# Patient Record
Sex: Male | Born: 1970 | Race: Black or African American | Hispanic: No | Marital: Single | Smoking: Current every day smoker
Health system: Southern US, Community
[De-identification: ages and names within clinical notes are randomized; demographics above are authoritative.]

## PROBLEM LIST (undated history)

## (undated) DIAGNOSIS — F319 Bipolar disorder, unspecified: Secondary | ICD-10-CM

## (undated) DIAGNOSIS — F32A Depression, unspecified: Secondary | ICD-10-CM

## (undated) DIAGNOSIS — F329 Major depressive disorder, single episode, unspecified: Secondary | ICD-10-CM

## (undated) DIAGNOSIS — R569 Unspecified convulsions: Secondary | ICD-10-CM

---

## 2007-08-14 ENCOUNTER — Emergency Department (HOSPITAL_COMMUNITY): Admission: EM | Admit: 2007-08-14 | Discharge: 2007-08-14 | Payer: Self-pay | Admitting: Emergency Medicine

## 2007-08-15 ENCOUNTER — Emergency Department (HOSPITAL_COMMUNITY): Admission: EM | Admit: 2007-08-15 | Discharge: 2007-08-16 | Payer: Self-pay | Admitting: Emergency Medicine

## 2007-08-16 ENCOUNTER — Ambulatory Visit: Payer: Self-pay | Admitting: Psychiatry

## 2007-08-16 ENCOUNTER — Inpatient Hospital Stay (HOSPITAL_COMMUNITY): Admission: AD | Admit: 2007-08-16 | Discharge: 2007-08-21 | Payer: Self-pay | Admitting: Psychiatry

## 2010-07-27 NOTE — H&P (Signed)
NAME:  Matthew Mora, PARISIEN NO.:  [id number]   MEDICAL RECORD NO.:  [id number]          PATIENT TYPE:  IPS   LOCATION:  0401                          FACILITY:  [facility]   PHYSICIAN:  Anselm Jungling, MD  DATE OF BIRTH:  1971-01-26   DATE OF ADMISSION:  08/16/2007  DATE OF DISCHARGE:                       PSYCHIATRIC ADMISSION ASSESSMENT   IDENTIFYING INFORMATION:  This is a 40 year old African American male,  voluntary admission.   HISTORY OF PRESENT ILLNESS:  First [facility] admission for this gentleman who  presented in the emergency room complaining of a bad headache and chest  pain.  He told the emergency room physician that he had hidden a knife  outside the emergency room and if he did not get treated for his chest  pain, he would go out and slash his wrist.  He had been also in our  emergency room on the previous day, where he was treated for a headache  and had indicated that he was homeless and had nowhere to go.  He was  discharged to the street at that time and returned to the emergency room  yesterday.  Today he reports that he has been living at an [facility]  for the past 4 months and had been abstinent from cocaine for 2 years.  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  He found himself  homeless and then relapsed Monday night on cocaine.  He says that he had  purposely used a large amount of cocaine this past week in order to kill  myself, had also made threats to cut his wrist.  He reports a history of  prior suicide attempts x6.  Denying any hallucinations today.  Denying  any active suicidal thoughts.   PAST PSYCHIATRIC HISTORY:  [place] reports a history of cocaine abuse since  he was age 73 and has recently had his longest period abstinent of 2  years.  Prior to being in the [facility], where he has  been for the  past 4 months here in [place], he was at [employer] in [place],  [place], where he was able to achieve abstinence.  Says that he  did well there.  He has been treated in the past with Seroquel and  Zoloft and also Depakote to try to stabilize his mood.  Was diagnosed  with a bipolar disorder in 2005.  Says that cocaine is his primary  addiction.  Has not been treated with amantadine or Symmetrel in the  past.   SOCIAL HISTORY:  A single African American male.  Family lives in  [place] and says that he does not want to return to [place] because  he was exposed to too many drugs there through his family.  He had lost  his job and was evicted from his [address] and is currently homeless.  He denies any current legal charges.   FAMILY HISTORY:  Remarkable for multiple members  with substance abuse.  Both parents with a history of alcoholism.   ALCOHOL AND DRUG HISTORY:  Noted above.   MEDICAL PROBLEMS:  Recent complaints of headache and chest pain.   CURRENT MEDICATIONS:  None.   DRUG ALLERGIES:  None.   PHYSICAL EXAM:  Done in the emergency room and as noted in the record,  he was noted to be uncooperative with staff in the emergency room but  did not act out.  He is 6 feet 2 inches tall, 174 pounds.  Temperature 97.8, pulse 68,  respirations 16, blood pressure 104/71.  He did not display any acute withdrawal symptoms in the emergency room.   Urine drug screen was positive for cocaine, negative for all other  substances.  His alcohol level was less than 5.  Chemistry within normal  limits.  Liver enzymes normal.  CBC within normal limits.  His BUN is 8,  creatinine 1.32.  A urinalysis remarkable for wbc's 11-20 and he reports  he is asymptomatic.  A noncontrast head CT was done because of his  complaint of headache and there was no evidence of any acute findings.  A chest x-ray revealed no cardiopulmonary disease.  An ECG revealed no  acute  findings.   MENTAL STATUS EXAM:  A fully alert male, poor eye contact.  He has been  reclusive to the room, staying in bed today, declining to participate in  group.  Affect is constricted.  Concentration is decreased.  Mood is  depressed, slightly irritable.  Thought content remarkable for reporting  that he has had some suicidal thoughts.  He has been cooperative here,  feels he can be safe on the unit.  Some racing thoughts.  Very depressed  about his relapse, depressed about his homelessness.  He is alert and  oriented x3.  No delusional statements.  Does not appear to be  internally distracted.  No signs of psychosis or delirium.  Judgment  poor.  Insight minimal.   AXIS I:  1.  Bipolar disorder, depressed.  2.  Cocaine abuse, rule out  dependence.  AXIS II:  Deferred.  AXIS III:  1.  Headache.  2.  History of chest pain, not otherwise  specified, resolved.  AXIS IV:  Severe problems with homelessness, lack of family support.  AXIS V:  Current 30, past year not known.   PLAN:  Voluntarily admit him to our dual-diagnosis program.  We are  going to restart his Depakote 1000 mg p.o. nightly., Celexa 20 mg q.a.m.  We will also start him on Librium 25 mg q.6 h. p.r.n. for any withdrawal  symptoms and have discussed amantadine 100 mg b.i.d. for his cocaine  cravings and addiction and that he has agreed to the care plan.  Estimated length of stay is 5 days.      Margaret A. Lorin Picket, N.P.      Anselm Jungling, MD  Electronically Signed    MAS/MEDQ  D:  08/16/2007  T:  08/16/2007  Job:  [id number]

## 2010-07-30 NOTE — Discharge Summary (Signed)
NAME:  JEROL, RUFENER NO.:  [id number]   MEDICAL RECORD NO.:  [id number]          PATIENT TYPE:  IPS   LOCATION:  0500                          FACILITY:  BH   PHYSICIAN:  Antonietta Breach, M.D.  DATE OF BIRTH:  March 06, 1971   DATE OF ADMISSION:  08/16/2007  DATE OF DISCHARGE:  08/21/2007                               DISCHARGE SUMMARY   IDENTIFYING DATA/REASON FOR ADMISSION:  Mr. Chittum is a 40 year old  male who presented to the emergency room with confusion, cocaine  intoxication.  He was threatening to cut his wrist.  He also had an  emergency room visit on August 14, 2007, for cocaine abuse.  The patient  was also expressing depression.   PERTINENT MEDICAL AND LABORATORY:  Unremarkable.   HOSPITAL COURSE:  Mr. Reither was admitted to the adult inpatient  psychiatric unit and underwent milieu and group psychotherapy therapy.  He was started on Depakote and titrated to 1000 mg ER p.o. q.h.s. as his  primary mood stabilizer for bipolar disorder.   For antidepression, he was started on Celexa 20 mg q.a.m.   His Celexa was titrated to 30 mg daily.   The patient progressively improved.   By August 20, 2007, the patient was not having any thoughts of harming  himself or others.  He was still having some depression.  His judgment  had returned to normal.   CONDITION ON DISCHARGE:  By August 21, 2007, the patient was not having any  thoughts of harming himself or others.  He was motivated to attend the  [address], in [place].  He was not having any abnormal  behavior.   AFTERCARE:  The patient is going to be attending [facility] at [facility] starting at 9 a.m. on August 22, 2007.   DISCHARGE MEDICATIONS:  1. Depakote extended release 1000 mg q.h.s.  2. Celexa 30 mg q.a.m.  3. Amantadine 100 mg b.i.d.   DISCHARGE DIAGNOSES:  AXIS I:  1. Bipolar disorder, not otherwise specified, 296.80.  2. Polysubstance abuse.  AXIS II:  Deferred.  AXIS III:   Hypertension.  AXIS IV:  1. Primary support group.  2. Occupational.  3. Housing problem.  AXIS V:  55.      Antonietta Breach, M.D.  Electronically Signed     JW/MEDQ  D:  08/24/2007  T:  08/24/2007  Job:  [id number]

## 2010-12-09 LAB — VALPROIC ACID LEVEL: Valproic Acid Lvl: 73.1

## 2010-12-09 LAB — COMPREHENSIVE METABOLIC PANEL
ALT: 14
BUN: 8
Calcium: 9.9
Glucose, Bld: 83
Sodium: 140
Total Protein: 7.5

## 2010-12-09 LAB — POCT I-STAT, CHEM 8
BUN: 7
Calcium, Ion: 1.17
Chloride: 104
Potassium: 3.2 — ABNORMAL LOW
Sodium: 140

## 2010-12-09 LAB — URINE MICROSCOPIC-ADD ON

## 2010-12-09 LAB — URINALYSIS, ROUTINE W REFLEX MICROSCOPIC
Ketones, ur: 15 — AB
Nitrite: NEGATIVE
Protein, ur: NEGATIVE
Urobilinogen, UA: 1

## 2010-12-09 LAB — RAPID URINE DRUG SCREEN, HOSP PERFORMED
Amphetamines: NOT DETECTED
Benzodiazepines: NOT DETECTED
Benzodiazepines: NOT DETECTED
Cocaine: POSITIVE — AB
Opiates: NOT DETECTED
Tetrahydrocannabinol: NOT DETECTED

## 2010-12-09 LAB — HEPATIC FUNCTION PANEL
Alkaline Phosphatase: 50
Indirect Bilirubin: 1.1 — ABNORMAL HIGH
Total Bilirubin: 1.2

## 2010-12-09 LAB — CBC
HCT: 44.1
Hemoglobin: 15.6
Hemoglobin: 16
Hemoglobin: 15.6
MCHC: 33.6
MCHC: 34
MCV: 92.1
Platelets: 177
RBC: 5.04
RDW: 14.4
WBC: 6.4

## 2010-12-09 LAB — DIFFERENTIAL
Basophils Relative: 0
Eosinophils Relative: 2
Eosinophils Relative: 1
Lymphocytes Relative: 22
Lymphocytes Relative: 25
Lymphs Abs: 1.6
Monocytes Relative: 10
Neutrophils Relative %: 70

## 2010-12-09 LAB — URINE CULTURE: Culture: NO GROWTH

## 2010-12-09 LAB — ETHANOL: Alcohol, Ethyl (B): <5

## 2011-02-16 ENCOUNTER — Emergency Department (HOSPITAL_COMMUNITY)
Admission: EM | Admit: 2011-02-16 | Discharge: 2011-02-16 | Disposition: A | Discharge status: Other Acute Inpatient Hospital | Payer: Self-pay | Attending: Emergency Medicine | Admitting: Emergency Medicine

## 2011-02-16 ENCOUNTER — Encounter (HOSPITAL_COMMUNITY): Payer: Self-pay | Admitting: Emergency Medicine

## 2011-02-16 DIAGNOSIS — F3289 Other specified depressive episodes: Secondary | ICD-10-CM | POA: Insufficient documentation

## 2011-02-16 DIAGNOSIS — R112 Nausea with vomiting, unspecified: Secondary | ICD-10-CM | POA: Insufficient documentation

## 2011-02-16 DIAGNOSIS — R51 Headache: Secondary | ICD-10-CM | POA: Insufficient documentation

## 2011-02-16 DIAGNOSIS — F329 Major depressive disorder, single episode, unspecified: Secondary | ICD-10-CM | POA: Insufficient documentation

## 2011-02-16 DIAGNOSIS — R45851 Suicidal ideations: Secondary | ICD-10-CM | POA: Insufficient documentation

## 2011-02-16 HISTORY — DX: Bipolar disorder, unspecified: F31.9

## 2011-02-16 LAB — RAPID URINE DRUG SCREEN, HOSP PERFORMED
Amphetamines: NOT DETECTED
Cocaine: NOT DETECTED
Opiates: NOT DETECTED
Tetrahydrocannabinol: NOT DETECTED

## 2011-02-16 LAB — COMPREHENSIVE METABOLIC PANEL
AST: 19 U/L (ref 0–37)
Albumin: 3.6 g/dL (ref 3.5–5.2)
BUN: 11 mg/dL (ref 6–23)
Calcium: 10 mg/dL (ref 8.4–10.5)
Creatinine, Ser: 0.93 mg/dL (ref 0.50–1.35)
GFR calc non Af Amer: >90 mL/min (ref >90)
Total Bilirubin: 0.3 mg/dL (ref 0.3–1.2)

## 2011-02-16 LAB — CBC
HCT: 43.6 % (ref 39.0–52.0)
Hemoglobin: 14.7 g/dL (ref 13.0–17.0)
MCV: 90.1 fL (ref 78.0–100.0)
RBC: 4.84 MIL/uL (ref 4.22–5.81)
WBC: 7.2 10*3/uL (ref 4.0–10.5)

## 2011-02-16 LAB — ETHANOL: Alcohol, Ethyl (B): <11 mg/dL (ref 0–11)

## 2011-02-16 MED ORDER — DIPHENHYDRAMINE HCL 50 MG/ML IJ SOLN
12.5000 mg | Freq: Once | INTRAMUSCULAR | Status: AC
  Administered 2011-02-16: 12.5 mg via INTRAVENOUS
  Filled 2011-02-16: qty 1

## 2011-02-16 MED ORDER — KETOROLAC TROMETHAMINE 60 MG/2ML IM SOLN
60.0000 mg | Freq: Once | INTRAMUSCULAR | Status: AC
  Administered 2011-02-16: 60 mg via INTRAMUSCULAR
  Filled 2011-02-16: qty 2

## 2011-02-16 MED ORDER — PROMETHAZINE HCL 25 MG/ML IJ SOLN
12.5000 mg | Freq: Once | INTRAMUSCULAR | Status: AC
  Administered 2011-02-16: 12.5 mg via INTRAVENOUS
  Filled 2011-02-16: qty 1

## 2011-02-16 NOTE — ED Provider Notes (Signed)
History     CSN: [id number] Arrival date & time: 02/16/2011  1:16 PM   First MD Initiated Contact with Patient 02/16/11 1518      No chief complaint on file.   (Consider location/radiation/quality/duration/timing/severity/associated sxs/prior treatment) HPI Comments: Patient presents today from [facility] for medical clearance and treatment for migraine headache.  Patient complains of headache that has been going on for the last 3 days and is typical for his migraines.  He denies that he is on any medications for migraines.  He has no weakness, numbness, fevers.  He does admit to some nausea and one episode of emesis today.  He has no abdominal pain.  Patient is being seen at [facility] for some suicidal ideation.  He denies that he has done anything in the last few days to harm himself.  When asked if he has done anything to harm himself the past he states yes but states he cannot recall what he did.  Patient does admit that he has bipolar disorder.  He does not know what medications he is on when besides Depakote.  Patient is a 40 y.o. male presenting with headaches. The history is provided by the patient. No language interpreter was used.  Headache  This is a recurrent problem. Associated symptoms include nausea and vomiting. Pertinent negatives include no fever and no shortness of breath.    Past Medical History  Diagnosis Date  . Bipolar 1 disorder     History reviewed. No pertinent past surgical history.  No family history on file.  History  Substance Use Topics  . Smoking status: Never Smoker   . Smokeless tobacco: Not on file  . Alcohol Use: No      Review of Systems  Constitutional: Negative.  Negative for fever and chills.  Eyes: Negative.  Negative for discharge and redness.  Respiratory: Negative.  Negative for cough and shortness of breath.   Cardiovascular: Negative.  Negative for chest pain.  Gastrointestinal: Positive for nausea and vomiting. Negative for abdominal  pain.  Genitourinary: Negative.  Negative for hematuria.  Musculoskeletal: Negative.  Negative for back pain.  Skin: Negative.  Negative for color change and rash.  Neurological: Positive for headaches. Negative for syncope.  Hematological: Negative.  Negative for adenopathy.  Psychiatric/Behavioral: Positive for suicidal ideas. Negative for confusion.  All other systems reviewed and are negative.    Allergies  Review of patient's allergies indicates no known allergies.  Home Medications   Current Outpatient Rx  Name Route Sig Dispense Refill  . DIVALPROEX SODIUM 250 MG PO TBEC Oral Take 250 mg by mouth 3 (three) times daily.        BP 118/70  Pulse 69  Temp(Src) 98.3 F (36.8 C) (Oral)  Resp 15  SpO2 98%  Physical Exam  Constitutional: He is oriented to person, place, and time. He appears well-developed and well-nourished.  Non-toxic appearance. He does not have a sickly appearance.  HENT:  Head: Normocephalic and atraumatic.  Eyes: Conjunctivae, EOM and lids are normal. Pupils are equal, round, and reactive to light.  Neck: Trachea normal, normal range of motion and full passive range of motion without pain. Neck supple.  Cardiovascular: Normal rate, regular rhythm and normal heart sounds.   Pulmonary/Chest: Effort normal and breath sounds normal. No respiratory distress. He has no wheezes. He has no rales.  Abdominal: Soft. Normal appearance. He exhibits no distension. There is no tenderness. There is no rebound and no CVA tenderness.  Musculoskeletal: Normal range of motion.  Neurological: He is alert and oriented to person, place, and time. He has normal strength.  Skin: Skin is warm, dry and intact. No rash noted.  Psychiatric: His behavior is normal. Judgment and thought content normal.       Flattened and depressed affect.    ED Course  Procedures (including critical care time)  Results for orders placed during the hospital encounter of 02/16/11  CBC       Component Value Range   WBC 7.2  4.0 - 10.5 (K/uL)   RBC 4.84  4.22 - 5.81 (MIL/uL)   Hemoglobin 14.7  13.0 - 17.0 (g/dL)   HCT 16.1  09.6 - 04.5 (%)   MCV 90.1  78.0 - 100.0 (fL)   MCH 30.4  26.0 - 34.0 (pg)   MCHC 33.7  30.0 - 36.0 (g/dL)   RDW 40.9  81.1 - 91.4 (%)   Platelets 185  150 - 400 (K/uL)  COMPREHENSIVE METABOLIC PANEL      Component Value Range   Sodium 138  135 - 145 (mEq/L)   Potassium 4.1  3.5 - 5.1 (mEq/L)   Chloride 102  96 - 112 (mEq/L)   CO2 29  19 - 32 (mEq/L)   Glucose, Bld 82  70 - 99 (mg/dL)   BUN 11  6 - 23 (mg/dL)   Creatinine, Ser 7.82  0.50 - 1.35 (mg/dL)   Calcium 95.6  8.4 - 10.5 (mg/dL)   Total Protein 7.6  6.0 - 8.3 (g/dL)   Albumin 3.6  3.5 - 5.2 (g/dL)   AST 19  0 - 37 (U/L)   ALT 21  0 - 53 (U/L)   Alkaline Phosphatase 59  39 - 117 (U/L)   Total Bilirubin 0.3  0.3 - 1.2 (mg/dL)   GFR calc non Af Amer >90  >90 (mL/min)   GFR calc Af Amer >90  >90 (mL/min)  ETHANOL      Component Value Range   Alcohol, Ethyl (B) <11  0 - 11 (mg/dL)  URINE RAPID DRUG SCREEN (HOSP PERFORMED)      Component Value Range   Opiates NONE DETECTED  NONE DETECTED    Cocaine NONE DETECTED  NONE DETECTED    Benzodiazepines NONE DETECTED  NONE DETECTED    Amphetamines NONE DETECTED  NONE DETECTED    Tetrahydrocannabinol NONE DETECTED  NONE DETECTED    Barbiturates NONE DETECTED  NONE DETECTED   VALPROIC ACID LEVEL      Component Value Range   Valproic Acid Lvl 49.9 (*) 50.0 - 100.0 (ug/mL)   No results found.     MDM  Patient has been treated for his headache and then reevaluation is noted complete improvement in his headache.  At this point in time he is medically cleared to go back to [facility] is his headache is resolved and his laboratory studies are normal.  His Depakote level is less than 1.0 the normal range so I do not believe any dosage adjustments are needed at this time.        Nat Christen, MD 02/16/11 [phone]

## 2011-02-16 NOTE — ED Notes (Signed)
Pt from [facility].  Attempted suicide last Friday by putting himself in front of cars on [address]. Has been inpatient at [facility] ever since.  C/o headache x 3 wk & twitching left eyelid.  Sent for med clearance to go back to [facility].

## 2011-02-16 NOTE — ED Notes (Signed)
Pt has changed into blue scrubs, belongings taken.  Security called to wand pt.

## 2012-04-01 ENCOUNTER — Emergency Department (HOSPITAL_COMMUNITY)
Admission: EM | Admit: 2012-04-01 | Discharge: 2012-04-03 | Disposition: A | Discharge status: Other Acute Inpatient Hospital | Payer: Self-pay | Attending: Emergency Medicine | Admitting: Emergency Medicine

## 2012-04-01 DIAGNOSIS — R45850 Homicidal ideations: Secondary | ICD-10-CM | POA: Insufficient documentation

## 2012-04-01 DIAGNOSIS — F319 Bipolar disorder, unspecified: Secondary | ICD-10-CM | POA: Insufficient documentation

## 2012-04-01 DIAGNOSIS — R45851 Suicidal ideations: Secondary | ICD-10-CM | POA: Insufficient documentation

## 2012-04-01 DIAGNOSIS — F411 Generalized anxiety disorder: Secondary | ICD-10-CM | POA: Insufficient documentation

## 2012-04-01 DIAGNOSIS — M25519 Pain in unspecified shoulder: Secondary | ICD-10-CM | POA: Insufficient documentation

## 2012-04-01 DIAGNOSIS — Z79899 Other long term (current) drug therapy: Secondary | ICD-10-CM | POA: Insufficient documentation

## 2012-04-01 DIAGNOSIS — F489 Nonpsychotic mental disorder, unspecified: Secondary | ICD-10-CM | POA: Insufficient documentation

## 2012-04-01 DIAGNOSIS — T1491XA Suicide attempt, initial encounter: Secondary | ICD-10-CM

## 2012-04-01 LAB — ETHANOL: Alcohol, Ethyl (B): 153 mg/dL — ABNORMAL HIGH (ref 0–11)

## 2012-04-01 LAB — CBC
HCT: 46.7 % (ref 39.0–52.0)
Hemoglobin: 15.7 g/dL (ref 13.0–17.0)
MCV: 89.8 fL (ref 78.0–100.0)
RDW: 12.6 % (ref 11.5–15.5)
WBC: 5.9 10*3/uL (ref 4.0–10.5)

## 2012-04-01 LAB — ACETAMINOPHEN LEVEL: Acetaminophen (Tylenol), Serum: <15 ug/mL (ref 10–30)

## 2012-04-01 LAB — SALICYLATE LEVEL: Salicylate Lvl: <2 mg/dL — ABNORMAL LOW (ref 2.8–20.0)

## 2012-04-01 LAB — RAPID URINE DRUG SCREEN, HOSP PERFORMED: Barbiturates: NOT DETECTED

## 2012-04-01 LAB — COMPREHENSIVE METABOLIC PANEL
Alkaline Phosphatase: 68 U/L (ref 39–117)
BUN: 8 mg/dL (ref 6–23)
Chloride: 99 mEq/L (ref 96–112)
Creatinine, Ser: 0.91 mg/dL (ref 0.50–1.35)
GFR calc Af Amer: >90 mL/min (ref >90)
GFR calc non Af Amer: >90 mL/min (ref >90)
Glucose, Bld: 62 mg/dL — ABNORMAL LOW (ref 70–99)
Potassium: 3.4 mEq/L — ABNORMAL LOW (ref 3.5–5.1)
Total Bilirubin: 0.3 mg/dL (ref 0.3–1.2)

## 2012-04-01 MED ORDER — ZIPRASIDONE MESYLATE 20 MG IM SOLR
20.0000 mg | Freq: Once | INTRAMUSCULAR | Status: AC
Start: 2012-04-01 — End: 2012-04-01
  Administered 2012-04-01: 20 mg via INTRAMUSCULAR
  Filled 2012-04-01: qty 20

## 2012-04-01 NOTE — ED Provider Notes (Signed)
History  This chart was scribed for non-physician practitioner working with Gilda Crease,  by Marlin Canary, ED Scribe and Bennett Scrape, ED Scribe. This patient was seen in room WTR3/WLPT3 and the patient's care was started at 2007.  CSN: [id number]  Arrival date & time 04/01/12  1940   First MD Initiated Contact with Patient 04/01/12 2007      Chief Complaint  Patient presents with  . Medical Clearance    The history is provided by the patient. No language interpreter was used.    Matthew Mora is a 42 y.o. male with a h/o bipolar disorder brought in by [employer] who presents to the Emergency Department complaining of medical clearance after being found running around in traffic [place]. Per police officer, pt was brandishing a stick running in and out of traffic and reported HI and SI on scene. Pt reports that he was kicked out by mother this morning about 10 hours ago. He reports that he has been feeling suicidal since around Dec. 25th, 2013 but it increased today after being kicked out. He reports that his plan is to run into incoming traffic or to lay on railroad tracks. He states that this is a frequent problem for him. He also states that he is homicidal and his plan is to get a hold of a gun and "start shooting". He reports that he had alcohol today, but has been clean from drugs (specifically cocaine) for the past 2 years. He denies any recent drug use. He states that he doesn't normally consume alcohol. He reports having recent HI/SI treatment at [facility]. He takes to Depakote, Zyprexa and Desyrel on a daily basis for his bipolar disorder. He states that he has taken Depakote today but not the other two. He also c/o constant left shoulder pain that he reports is new after falling on the sidewalk. He rates his pain a 10 out of 10 and states that it is worse with use of the left arm. He denies having any other pain or symptoms currently.    Past Medical History    Diagnosis Date  . Bipolar 1 disorder     No past surgical history on file.  No family history on file.  History  Substance Use Topics  . Smoking status: Never Smoker   . Smokeless tobacco: Not on file  . Alcohol Use: No      Review of Systems  Constitutional: Negative for fever, diaphoresis, appetite change, fatigue and unexpected weight change.  HENT: Negative for mouth sores and neck stiffness.   Eyes: Negative for visual disturbance.  Respiratory: Negative for cough, chest tightness, shortness of breath and wheezing.   Cardiovascular: Negative for chest pain.  Gastrointestinal: Negative for nausea, vomiting, abdominal pain, diarrhea and constipation.  Genitourinary: Negative for dysuria, urgency, frequency and hematuria.  Musculoskeletal: Negative for back pain.  Skin: Negative for rash.  Neurological: Negative for syncope, light-headedness and headaches.  Hematological: Does not bruise/bleed easily.  Psychiatric/Behavioral: Positive for suicidal ideas. Negative for sleep disturbance. The patient is not nervous/anxious.     Allergies  Review of patient's allergies indicates no known allergies.  Home Medications   Current Outpatient Rx  Name  Route  Sig  Dispense  Refill  . DIVALPROEX SODIUM ER 500 MG PO TB24   Oral   Take 500-1,000 mg by mouth 2 (two) times daily. 1 tab in am, 2 tabs in pm         . OLANZAPINE 20 MG  PO TABS   Oral   Take 20 mg by mouth at bedtime.         . TRAZODONE HCL 100 MG PO TABS   Oral   Take 50-100 mg by mouth at bedtime as needed. For sleep.           Triage Vitals: BP 127/69  Pulse 97  Temp 98.2 F (36.8 C) (Oral)  Resp 18  Ht 6\' 2"  (1.88 m)  Wt 194 lb 3.2 oz (88.089 kg)  BMI 24.93 kg/m2  SpO2 100%  Physical Exam  Nursing note and vitals reviewed. Constitutional: He is oriented to person, place, and time. He appears well-developed and well-nourished. No distress.  HENT:  Head: Normocephalic and atraumatic.   Mouth/Throat: Oropharynx is clear and moist. No oropharyngeal exudate.       Ear canals are clear bilaterally, TMs are clear bilaterally, no bulging or erythema; oropharynx is clear, no exudate or erythema  Eyes: Conjunctivae normal and EOM are normal. Pupils are equal, round, and reactive to light. No scleral icterus.       No injection of the sclera   Neck: Normal range of motion and full passive range of motion without pain. Neck supple. Muscular tenderness present. No spinous process tenderness present. No rigidity. Normal range of motion present.  Cardiovascular: Normal rate, regular rhythm and intact distal pulses.   Pulmonary/Chest: Effort normal and breath sounds normal. No respiratory distress. He has no wheezes.       Clear and equal bilaterally  Abdominal: Soft. Bowel sounds are normal. He exhibits no mass. There is no tenderness. There is no rebound and no guarding.  Musculoskeletal: Normal range of motion. He exhibits tenderness. He exhibits no edema.       Paraspinal tenderness in the cervical area, no spinous processes tenderness in the cervical, thoracic or lumbar spine, full ROM of the left shoulder with pain, no tenderness to the humerus, full ROM to the left elbow   Neurological: He is alert and oriented to person, place, and time.       Speech is clear and goal oriented Moves extremities without ataxia 5/5 Strength bilaterally in the upper extremities, reflexes are intact in the upper extremities, 5/5 strength bilaterally in the lower extremities, distal pulses are intact in all 4 extremities  Skin: Skin is warm and dry. No rash noted. He is not diaphoretic. No erythema.       Cap refill is <3 sec bilaterally  Psychiatric: He has a normal mood and affect. He expresses homicidal and suicidal ideation. He expresses suicidal plans and homicidal plans.    ED Course  Procedures (including critical care time)  DIAGNOSTIC STUDIES: Oxygen Saturation is 100% on room air, normal  by my interpretation.    COORDINATION OF CARE: 8:17 PM- Discussed treatment plan which includes xray of shoulder and ACT evaluation with pt at bedside and pt agreed to plan.    Labs Reviewed  COMPREHENSIVE METABOLIC PANEL - Abnormal; Notable for the following:    Potassium 3.4 (*)     Glucose, Bld 62 (*)     Total Protein 8.6 (*)     All other components within normal limits  ETHANOL - Abnormal; Notable for the following:    Alcohol, Ethyl (B) 153 (*)     All other components within normal limits  SALICYLATE LEVEL - Abnormal; Notable for the following:    Salicylate Lvl <2.0 (*)     All other components within normal limits  ACETAMINOPHEN  LEVEL  CBC  URINE RAPID DRUG SCREEN (HOSP PERFORMED)   No results found.   1. Suicidal ideation   2. Suicide attempt       MDM  Matthew Mora presents with [employer] after being found in traffic.  Patient presents to the ER with a number of risk factors for suicide for example the patient has a history of bipolar disorder.  Pt has a plan and was found attempting to execute that plan. Under these circumstances I would conservatively estimate the suicide risk to be high. Current Plan is to have patient be evaluated by ACT and telepsych for further assessment on weather or not to be placed inpatient.  IVC papers have been completed.  Pt became agitated in the department yelling and screaming.  Pt given geodon 20mg  IM with decrease in agitation.    Urine drug screen negative, acetaminophen level within normal limits, CMP mild hypokalemia at 3.4 replaced in the department. EtOH 153, salicylate level negative and CBC unremarkable.  Patient is at risk to himself and others and IVC papers have been completed. I discussed with the act team and they're working on placement.  Patient is otherwise medically cleared.  Patient has been cleared to move to [facility] pending further assessment.   I personally performed the services described in this documentation, which  was scribed in my presence. The recorded information has been reviewed and is accurate.      Dahlia Client Weylyn Ricciuti, PA-C 04/01/12 [id number]

## 2012-04-01 NOTE — BHH Counselor (Signed)
Magistrate Neale Burly confirmed receipt of faxed IVC paperwork. He says officers have picked up paperwork and are en route to [facility].  Evette Cristal, [place] Assessment Counselor

## 2012-04-01 NOTE — ED Notes (Signed)
Patient is resting comfortably. Lying in the bed. Pt is calm.

## 2012-04-01 NOTE — ED Notes (Signed)
Pt states he "wanted to kill himself" today. Pt states he has had "recent episodes" where he "wanted to kill" himself. Pt admits to recent change in residence, mother threw him out this morning. Pt admits to using ETOH today. Pt states he has been taking his medication as prescribed.

## 2012-04-02 ENCOUNTER — Emergency Department (HOSPITAL_COMMUNITY): Payer: Self-pay

## 2012-04-02 LAB — GLUCOSE, CAPILLARY: Glucose-Capillary: 71 mg/dL (ref 70–99)

## 2012-04-02 MED ORDER — TRAZODONE HCL 100 MG PO TABS
100.0000 mg | ORAL_TABLET | Freq: Every day | ORAL | Status: DC
  Administered 2012-04-02: 100 mg via ORAL
  Filled 2012-04-02: qty 1

## 2012-04-02 MED ORDER — OLANZAPINE 5 MG PO TABS
20.0000 mg | ORAL_TABLET | Freq: Every day | ORAL | Status: DC
  Administered 2012-04-02: 20 mg via ORAL
  Filled 2012-04-02: qty 4

## 2012-04-02 MED ORDER — ZIPRASIDONE MESYLATE 20 MG IM SOLR: 20.0000 mg | Freq: Two times a day (BID) | INTRAMUSCULAR | Status: DC | PRN

## 2012-04-02 MED ORDER — DIVALPROEX SODIUM 500 MG PO DR TAB
500.0000 mg | DELAYED_RELEASE_TABLET | Freq: Two times a day (BID) | ORAL | Status: DC
  Administered 2012-04-02 (×2): 500 mg via ORAL
  Filled 2012-04-02 (×3): qty 1

## 2012-04-02 MED ORDER — LORAZEPAM 1 MG PO TABS
2.0000 mg | ORAL_TABLET | Freq: Three times a day (TID) | ORAL | Status: DC | PRN
  Administered 2012-04-02: 2 mg via ORAL
  Filled 2012-04-02: qty 2

## 2012-04-02 NOTE — ED Provider Notes (Signed)
8:41 AM Patient sleeping.  There was an episode of agitation overnight, during the telepsych eval.  VSS  1/20 telepsych rec's inpatient admission. meds ordered   Gerhard Munch, MD 04/02/12 [phone]

## 2012-04-02 NOTE — [facility] Counselor (Signed)
Pt referred to [facility], [facility], and [facility] pending review.

## 2012-04-02 NOTE — ED Notes (Signed)
Writer is allowing pt to sleep, due to pt being agitated earlier this am.  RN is notified about VS NOT being done this am.

## 2012-04-02 NOTE — ED Provider Notes (Signed)
Medical screening examination/treatment/procedure(s) were conducted as a shared visit with non-physician practitioner(s) and myself.  I personally evaluated the patient during the encounter.  Pt admits SI/HI feeling better after Geodon in ED now calm and cooperative, but still has some SI/HI thoughts.  States recently admitted at [facility] for same and started new meds. ACT and TelePsych evals pnd. Pt is IVC brought to ED by police.  Hurman Horn, MD 04/08/12 [phone]
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
## 2012-04-02 NOTE — ED Notes (Addendum)
Spoke to patient . States he is having racing thoughts to hurt his self. Rt arm pain. And he states he overall does not feel well.

## 2012-04-02 NOTE — ED Notes (Signed)
Patient CBG 71 . Patient given graham crackers/ peanut butter and milk. Encourage to eat. Will monitor.

## 2012-04-02 NOTE — ED Notes (Signed)
Patient transported to X-ray- for rt shoulder

## 2012-04-03 ENCOUNTER — Inpatient Hospital Stay (HOSPITAL_COMMUNITY)
Admission: EM | Admit: 2012-04-03 | Discharge: 2012-04-16 | DRG: 885 | Disposition: A | Discharge status: Home / Self Care | Payer: Federal, State, Local not specified - Other | Source: Ambulatory Visit | Attending: Psychiatry | Admitting: Psychiatry

## 2012-04-03 ENCOUNTER — Encounter (HOSPITAL_COMMUNITY): Payer: Self-pay | Admitting: Emergency Medicine

## 2012-04-03 ENCOUNTER — Encounter (HOSPITAL_COMMUNITY): Payer: Self-pay | Admitting: Psychiatry

## 2012-04-03 DIAGNOSIS — F329 Major depressive disorder, single episode, unspecified: Secondary | ICD-10-CM | POA: Diagnosis present

## 2012-04-03 DIAGNOSIS — R45850 Homicidal ideations: Secondary | ICD-10-CM

## 2012-04-03 DIAGNOSIS — F32A Depression, unspecified: Secondary | ICD-10-CM | POA: Diagnosis present

## 2012-04-03 DIAGNOSIS — F99 Mental disorder, not otherwise specified: Secondary | ICD-10-CM | POA: Diagnosis present

## 2012-04-03 DIAGNOSIS — F411 Generalized anxiety disorder: Secondary | ICD-10-CM | POA: Diagnosis present

## 2012-04-03 DIAGNOSIS — R45851 Suicidal ideations: Secondary | ICD-10-CM

## 2012-04-03 DIAGNOSIS — F39 Unspecified mood [affective] disorder: Secondary | ICD-10-CM | POA: Diagnosis present

## 2012-04-03 DIAGNOSIS — Z79899 Other long term (current) drug therapy: Secondary | ICD-10-CM

## 2012-04-03 DIAGNOSIS — F3130 Bipolar disorder, current episode depressed, mild or moderate severity, unspecified: Principal | ICD-10-CM | POA: Diagnosis present

## 2012-04-03 MED ORDER — LORAZEPAM 1 MG PO TABS
2.0000 mg | ORAL_TABLET | Freq: Once | ORAL | Status: AC
  Administered 2012-04-03: 2 mg via ORAL

## 2012-04-03 MED ORDER — LORAZEPAM 1 MG PO TABS
1.0000 mg | ORAL_TABLET | Freq: Four times a day (QID) | ORAL | Status: DC | PRN
  Administered 2012-04-07: 1 mg via ORAL
  Filled 2012-04-03: qty 1

## 2012-04-03 MED ORDER — OLANZAPINE 10 MG PO TABS
20.0000 mg | ORAL_TABLET | Freq: Every day | ORAL | Status: DC
  Administered 2012-04-03 – 2012-04-15 (×13): 20 mg via ORAL
  Filled 2012-04-03 (×14): qty 2

## 2012-04-03 MED ORDER — MAGNESIUM HYDROXIDE 400 MG/5ML PO SUSP: 30.0000 mL | Freq: Every day | ORAL | Status: DC | PRN

## 2012-04-03 MED ORDER — DIVALPROEX SODIUM ER 500 MG PO TB24
1000.0000 mg | ORAL_TABLET | Freq: Every day | ORAL | Status: DC
  Administered 2012-04-04 – 2012-04-05 (×2): 1000 mg via ORAL
  Filled 2012-04-03 (×4): qty 2

## 2012-04-03 MED ORDER — TRAZODONE HCL 100 MG PO TABS
100.0000 mg | ORAL_TABLET | Freq: Every day | ORAL | Status: DC
  Administered 2012-04-05 – 2012-04-07 (×3): 100 mg via ORAL
  Filled 2012-04-03 (×7): qty 1

## 2012-04-03 MED ORDER — ACETAMINOPHEN 325 MG PO TABS: 650.0000 mg | ORAL_TABLET | Freq: Four times a day (QID) | ORAL | Status: DC | PRN

## 2012-04-03 MED ORDER — DIVALPROEX SODIUM ER 500 MG PO TB24
500.0000 mg | ORAL_TABLET | Freq: Every day | ORAL | Status: DC
  Administered 2012-04-04 – 2012-04-06 (×3): 500 mg via ORAL
  Filled 2012-04-03 (×5): qty 1

## 2012-04-03 MED ORDER — ALUM & MAG HYDROXIDE-SIMETH 200-200-20 MG/5ML PO SUSP: 30.0000 mL | ORAL | Status: DC | PRN

## 2012-04-03 MED ORDER — LORAZEPAM 1 MG PO TABS
ORAL_TABLET | ORAL | Status: AC
  Filled 2012-04-03: qty 2

## 2012-04-03 NOTE — Progress Notes (Signed)
Patient ID: Matthew Mora, male   DOB: Jun 06, 1970, 42 y.o.   MRN: [id number] D:  Patient admitted to the unit around 1 PM.  Prior to being brought back to the unit he was agitated and was given 2 mg of lorazepam PO.  When he got back to the unit he went to bed and has slept since.  He refuses to answer any questions of me.  He also refused his Depakote this evening, states he already had some today.   A:  I explained that I needed to complete his admission assessment, but he will not answer questions at this time.  Also explained that Depakote is a medication that is often given in divided doses throughout the day.  He was ordered a Do Not Admit due to agitation.  He is on an involuntary commitment.   R:  Irritable and not interacting with staff or peers.  Does not come out of his room for groups.  Admission is not complete at this time.

## 2012-04-03 NOTE — ED Notes (Signed)
Writer finds patient sitting behind door. Writer ask patient how does he feel,  Patient complains of racing thoughts.Writer informed patient that she will get him some medication per [facility].
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
## 2012-04-03 NOTE — BHH Counselor (Signed)
Patient accepted to [facility] by Donell Sievert, PA to Dr. Patrick North. The room # is 507-1. EDP-Dr. Deretha Emory was notified of patients disposition . Patients nurse-Wendy was also notified. The call report # is [phone]. Patient is under IVC and will need to be transported via [employer] to [facility] for inpatient treatment.

## 2012-04-03 NOTE — Tx Team (Signed)
Initial Interdisciplinary Treatment Plan  PATIENT STRENGTHS: (choose at least two) General fund of knowledge  PATIENT STRESSORS: Substance abuse   PROBLEM LIST: Problem List/Patient Goals Date to be addressed Date deferred Reason deferred Estimated date of resolution  Suicidal gestures 03 Apr 2012                                                      DISCHARGE CRITERIA:  Ability to meet basic life and health needs Adequate post-discharge living arrangements Improved stabilization in mood, thinking, and/or behavior Motivation to continue treatment in a less acute level of care  PRELIMINARY DISCHARGE PLAN: Attend aftercare/continuing care group Outpatient therapy Placement in alternative living arrangements  PATIENT/FAMIILY INVOLVEMENT: This treatment plan has been presented to and reviewed with the patient, Matthew Mora, and/or family member.  The patient and family have been given the opportunity to ask questions and make suggestions.  Matthew Mora Mae 04/03/2012, 7:36 PM

## 2012-04-03 NOTE — Progress Notes (Signed)
D: Patient in bed on approach.  Patient di wake up for a few minutes to speak with Clinical research associate.  Patient agitated and states he does not want to be bothered.  Patient states he will not take his Depakote because he has been taking it since 1994 and it does not work.  Patient states he is always hungry and he is not getting enough food here.  Patient had two empty meal trays in his room at this time.  Patient states he is having suicidal thought on and off but verbally contracts for safety.  Patient state he is having homo cidal thoughts but will not say towards who.  Patient states he is positive for auditory hallucinations.  Patient states he hears voices that tell him all kinds of things but will not elaborate or be specific with Clinical research associate.  Patient verbally contracts for safety.  Patient did finally get out of bed to get his night time medications.  Patient sat in the dayroom to eat a snack and patient applied heat pack to his left shoulder. A: Staff to monitor Q 15 mins for safety.  Encouragement and support offered.  Offered patient scheduled medications and writer attempted to administer scheduled medications. R: Patient remains safe on the unit.  Patient refused Depakote and trazodone tonight.  Patient did not attend group tonight.  Patient had relief after heat pack applied to left shoulder.  Patient taking administered medications.

## 2012-04-03 NOTE — Progress Notes (Signed)
Admission is not complete at this time.  Patient continues to refuse to answer questions.  Behavioral Health admission assessment was documented as "Refuses to answer" on most areas of the form.  Oncoming shift is aware that the admission has not been fully completed.

## 2012-04-03 NOTE — ED Notes (Signed)
Pt being transferred to [facility]. Report called to [facility]. [employer] called for transport.

## 2012-04-04 MED ORDER — PANTOPRAZOLE SODIUM 40 MG PO TBEC
40.0000 mg | DELAYED_RELEASE_TABLET | Freq: Every day | ORAL | Status: DC
  Administered 2012-04-04 – 2012-04-15 (×11): 40 mg via ORAL
  Filled 2012-04-04 (×15): qty 1

## 2012-04-04 MED ORDER — DOCUSATE SODIUM 100 MG PO CAPS
100.0000 mg | ORAL_CAPSULE | Freq: Two times a day (BID) | ORAL | Status: DC
  Administered 2012-04-04 – 2012-04-15 (×20): 100 mg via ORAL
  Filled 2012-04-04 (×28): qty 1
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
## 2012-04-04 NOTE — Progress Notes (Signed)
  D) Patient irritable upon my assessment. Patient resting quietly in room upon my approach. Patient states "I can't be around people right now." Patient given authorization by MD to remain in room during group therapies for "a couple of days." Patient completed Patient Self Inventory, reports slept "poor," and  appetite is "improving." Patient rates depression as  10 /10, patient rates hopeless feelings as  10/10. Patient endorses passive SI, contracts verbally for safety with RN. Patient denies HI. Patient endorses AVH, states "I see and hear things," patient refuses to "answer any more questions."    A) Patient offered support and encouragement, patient encouraged to discuss feelings/concerns with staff. Patient verbalized understanding. Patient monitored Q15 minutes for safety. Patient met with MD  to discuss today's goals and plan of care.  R) Patient isolates to room ,  Not attending groups in day room but is attending meals in dining room. Patient irritable and angry at times  with staff.   Patient taking medications as ordered. Will continue to monitor.

## 2012-04-04 NOTE — Tx Team (Addendum)
Interdisciplinary Treatment Plan Update (Adult)  Date:  04/04/2012  Time Reviewed:  10:39 AM   Progress in Treatment: Attending groups:   Yes   Participating in groups:  Yes Taking medication as prescribed:  Yes Tolerating medication:  Yes Family/Significant othe contact made: Contact to be made with family Patient understands diagnosis:  Yes Discussing patient identified problems/goals with staff: Yes Medical problems stabilized or resolved: Yes Denies suicidal/homicidal ideation: Yes Issues/concerns per patient self-inventory:  Other:   New problem(s) identified:  Reason for Continuation of Hospitalization: Anxiety Depression Medication stabilization Suicidal ideation  Interventions implemented related to continuation of hospitalization:  Medication Management; safety checks q 15 mins  Additional comments:  Estimated length of stay:  2-3 days  Discharge Plan:  Home with outpatient follow up  New goal(s):  Review of initial/current patient goals per problem list:    1.  Goal(s): Eliminate SI/other thoughts of self harm   Met:  No  Target date: d/c  As evidenced by: Patient will no longer endorse SI/HI or other thoughts of self harm.    2.  Goal (s):Reduce depression/anxiety  (patient would not rate symptoms)  Met:  No  Target date: d/c  As evidenced by: Patient will rate symptoms at four or below    3.  Goal(s):.stabilize on meds   Met:  No  Target date: d/c  As evidenced by: Patient will report being stabilized on medications - less symptomatic    4.  Goal(s): Refer for outpatient follow up   Met:  No  Target date: d/c  As evidenced by: Follow up appointment to be scheduled    Attendees: Patient:   Matthew Mora 04/04/2012 10:39 AM  Physican:  Patrick North, MD 04/04/2012 10:39 AM  Nursing:   Harold Barban, RN 04/04/2012 10:39 AM   Nursing:   Berneice Heinrich, RN 04/04/2012 10:39 AM   Clinical Social Worker:  Juline Patch, LCSW  04/04/2012 10:39 AM   Other: Tera Helper, PHM-NP 04/04/2012 10:39 AM   Other:   04/04/2012 10:39 AM Other:        04/04/2012 10:39 AM

## 2012-04-04 NOTE — H&P (Signed)
Reviewed. Patient seen and assessed. Agree with key elements of H & P. Patient very irritable, he will not have a room mate and is exempted from attending groups. Continue home medications and adjust as needed.

## 2012-04-04 NOTE — Clinical Social Work Note (Signed)
[facility] LCSW Aftercare Discharge Planning Group Note       8:30-9:30 AM  1/22/201410:42 AM  Participation Quality:  Patient did not attend group.    Wynn Banker 04/04/2012 10:42 AM

## 2012-04-04 NOTE — Progress Notes (Signed)
D: Patient in his room in bed on approach.  Patient state she does not like to be around a lot of people so he chooses to stay in his room.  Patient irritable and agitated while speaking to Clinical research associate.  Patient state she is going to take his medications tonight.  Patient States he has been having racing thoughts all day.  Patient states he is positive for SI/HI and AVH.  Patient verbally contracts for safety.  Patient does not endorse a plan.  Patient will not state who he is having homicidal thought towards.  Patient state he hears voices and states the voices tell him to harm himself.  Patient states he sees little black people that he knows are not there.    A: Staff to monitor Q 15 mins for safety.  Encouragement and support offered.  Scheduled medication administered per orders. R: Patient remains safe on the unit.  Patient did not attend group tonight but patient did get out of bed for snack and to get his bedtime medication.  Patient refused trazodone because patient states he was taking too many medications   2150: PAtietn noticed RN that he had blood in his stool.  RN/writer did observe a moderate amount of bright red blood in the toilet along with formed stool.  Patient states this is the first time this has happened since he ha been in the hospital but states the he has had times where he has had blood in his stool at home and states he did not notify his mother or anyone else.  Patient states his lower abdomen is hurting.  Patient states his stomach is hurting because he is taking too many medications.  Donell Sievert PA notified new orders received.  Vital Signs stable.

## 2012-04-04 NOTE — Tx Team (Signed)
Interdisciplinary Treatment Plan Update (Adult)  Date:  04/04/2012  Time Reviewed:  10:22 AM   Progress in Treatment: Attending groups:   Yes   Participating in groups:  Yes Taking medication as prescribed:  Yes Tolerating medication:  Yes Family/Significant othe contact made: Contact made with family Patient understands diagnosis:  Yes Discussing patient identified problems/goals with staff: Yes Medical problems stabilized or resolved: Yes Denies suicidal/homicidal ideation:Yes Issues/concerns per patient self-inventory:  Other:   New problem(s) identified:  Reason for Continuation of Hospitalization: Anxiety Depression Medication stabilization Suicidal ideation  Interventions implemented related to continuation of hospitalization:  Medication Management; safety checks q 15 mins  Additional comments:  Estimated length of stay:  3-5days  Discharge Plan:  Home with outpatient follow up  New goal(s):  Review of initial/current patient goals per problem list:    1.  Goal(s): Eliminate SI/other thoughts of self harm   Met:  No  Target date: d/c  As evidenced by: Patient will no longer endorse SI/HI or other thoughts of self harm.    2.  Goal (s):Reduce depression/anxiety (rated at ten today)  Met:  No  Target date: d/c  As evidenced by: Patient will rate symptoms at four or below    3.  Goal(s):.stabilize on meds   Met:  No  Target date: d/c  As evidenced by: Patient will report being stabilized on medications - less symptomatic    4.  Goal(s):  Eliminate or decrease psychotic symptoms   Met:  No  Target date: d/c  As evidenced by:  Patient will reports he is no longer having symptoms or will report less frequent occurrences    Attendees: Patient:   04/04/2012 10:22 AM  Physican:  Patrick North, MD 04/04/2012 10:22 AM  Nursing:    Harold Barban, RN 04/04/2012 10:22 AM   Nursing:   Berneice Heinrich, RN 04/04/2012 10:22 AM   Clinical Social  Worker:  Juline Patch, LCSW 04/04/2012 10:22 AM   Other: Tera Helper, PHM-NP 04/04/2012 10:22 AM   Other:         04/04/2012 10:22 AM Other:        04/04/2012 10:22 AM

## 2012-04-04 NOTE — Progress Notes (Signed)
Patient ID: Shariq Puig, male   DOB: 09-Nov-1970, 42 y.o.   MRN: [id number]  S: Pt is a 42 y.o AAM who presented to [facility] with concerns with painless BRBPR x one episode this evening. Pt endorses a similar event approx one year ago. Pt denies h/o of prior abd surgeries, h/o of PUD, Colitis, IBD, Polyps or colon CA. Pt denies family h/o of Colon CA. Pt denies ongoing anorexia, failure to thrive, unexplained weight loss, diarrhea, floating stools, or tenesmus sx. Pt denies syncopal or near syncopal events.  O: V/S standing 132/90 HR 89, lying 128/88 HR 64  Pulm; CTA  Cardio: audible s1,s2 without M/G/R  Abd: soft with normoactive BS exam diffuse tender without gaurding or rebound. No palpable M/O  Rectal: Deferred  A/P:  Check cbc, coag's in am  GI consultation in am  PPI Rx  start Colace Rx

## 2012-04-04 NOTE — Clinical Social Work Note (Signed)
[facility] LCSW Group Therapy  Emotional Regulation 1:15 - 2: 30 PM        04/04/2012  3:25 PM   Type of Therapy:  Group Therapy  Participation Level:  Did not attend group.    Wynn Banker 04/04/2012 3:25 PM

## 2012-04-04 NOTE — [facility] Suicide Risk Assessment (Signed)
Suicide Risk Assessment  Admission Assessment     Nursing information obtained from:  Review of record Demographic factors:  Male;Low socioeconomic status;Unemployed Current Mental Status:  Suicidal ideation indicated by others Loss Factors:  Financial problems / change in socioeconomic status Historical Factors:   (Unknown) Risk Reduction Factors:   (Unknown)  CLINICAL FACTORS:   Bipolar Disorder:   Bipolar II  COGNITIVE FEATURES THAT CONTRIBUTE TO RISK:  Closed-mindedness Thought constriction (tunnel vision)    SUICIDE RISK:   Moderate:  Frequent suicidal ideation with limited intensity, and duration, some specificity in terms of plans, no associated intent, good self-control, limited dysphoria/symptomatology, some risk factors present, and identifiable protective factors, including available and accessible social support.  PLAN OF CARE: Initiate home medications, adjust medications as needed. Plan for discharge when safe and stable. I certify that inpatient services furnished can reasonably be expected to improve the patient's condition.  Matthew Mora 04/04/2012, 12:28 PM

## 2012-04-05 LAB — CBC
MCH: 30.5 pg (ref 26.0–34.0)
Platelets: 171 10*3/uL (ref 150–400)
RBC: 4.99 MIL/uL (ref 4.22–5.81)

## 2012-04-05 LAB — PROTIME-INR
INR: 0.89 (ref 0.00–1.49)
Prothrombin Time: 12 seconds (ref 11.6–15.2)

## 2012-04-05 MED ORDER — OLANZAPINE 10 MG PO TABS
10.0000 mg | ORAL_TABLET | Freq: Every day | ORAL | Status: DC
  Administered 2012-04-05 – 2012-04-15 (×11): 10 mg via ORAL
  Filled 2012-04-05 (×7): qty 1
  Filled 2012-04-05: qty 42
  Filled 2012-04-05: qty 1
  Filled 2012-04-05: qty 42
  Filled 2012-04-05 (×4): qty 1

## 2012-04-05 NOTE — Progress Notes (Signed)
Psychoeducational Group Note  Date:  04/05/2012 Time:  1100   Group Topic/Focus:  Overcoming Stress:   The focus of this group is to define stress and help patients assess their triggers.  Participation Level: Did Not Attend  Participation Quality:  Not Applicable  Affect:  Not Applicable  Cognitive:  Not Applicable  Insight:  Not Applicable  Engagement in Group: Not Applicable  Additional Comments:  Patient did not attend group.   Noah Charon 04/05/2012, 11:43 AM

## 2012-04-05 NOTE — Progress Notes (Signed)
  D) Patient resting quietly in bed upon my assessment. Patient irritable and guarded at times. Patient completed Patient Self Inventory, reports slept "poor," and  appetite is "poor." Patient rates depression as   10/10, patient rates hopeless feelings as  10/10. Patient endorses SI, contracts verbally for safety with RN. Patient denies HI, denies A/V hallucinations.   A) Patient offered support and encouragement, patient encouraged to discuss feelings/concerns with staff. Patient verbalized understanding. Patient monitored Q15 minutes for safety. Patient met with MD  to discuss today's goals and plan of care.  R) Patient isolates to room, does not attend groups in day room and rarely attends meals in dining room. Patient irritable with staff at times.   Patient taking medications as ordered. Will continue to monitor.

## 2012-04-05 NOTE — Progress Notes (Signed)
[facility] LCSW Group Therapy  04/05/2012 5:07 PM  Type of Therapy:  Group Therapy  Participation Level:  None  Participation Quality:  Appropriate  Affect:  Blunted, Depressed and Flat  Cognitive:  Alert and Appropriate  Insight:  Developing/Improving  Engagement in Therapy:  Lacking  Modes of Intervention:  Discussion, Education, Exploration, Problem-solving and Support  Summary of Progress/Problems: Patient listened to speaker from [employer] but made on comments on presentation.   Wynn Banker 04/05/2012, 5:07 PM

## 2012-04-05 NOTE — Clinical Social Work Note (Signed)
[name] LCSW Aftercare Discharge Planning Group Note  04/05/2012 8:45 AM   Participation Quality: Did Not Attend group, stating his stomach hurts  Matthew Mora, LCSWA  04/05/2012 9:20 AM

## 2012-04-05 NOTE — Progress Notes (Signed)
Adult Psychoeducational Group Note  Date:  04/05/2012 Time:  11:25 AM  Group Topic/Focus:  Therapeutic activity  Participation Level:  Did Not Attend  Participation Quality:    Affect:    Cognitive:    Insight:   Engagement in Group:    Modes of Intervention:    Additional Comments: none  Marquis Lunch, Chesky Heyer 04/05/2012, 11:25 AM

## 2012-04-05 NOTE — Progress Notes (Signed)
[facility] MD Progress Note  04/05/2012 12:33 PM Matthew Mora  MRN:  [id number] Subjective:  Patient reports feeling irritable. Reports he has not been sleeping well. Not attending groups due to increased irritability. Endorsing depressed mood and suicidal thoughts.  Diagnosis:   Axis I: Bipolar, Depressed Axis II: Deferred Axis III:  Past Medical History  Diagnosis Date  . Bipolar 1 disorder    Axis IV: other psychosocial or environmental problems Axis V: 41-50 serious symptoms  ADL's:  Impaired  Sleep: Poor  Appetite:  Fair  Psychiatric Specialty Exam: Review of Systems  Constitutional: Negative.   HENT: Negative.   Eyes: Negative.   Respiratory: Negative.   Cardiovascular: Negative.   Gastrointestinal: Positive for abdominal pain.  Genitourinary: Negative.   Musculoskeletal: Negative.   Skin: Negative.   Neurological: Negative.   Endo/Heme/Allergies: Negative.   Psychiatric/Behavioral: Positive for depression. The patient is nervous/anxious.     Blood pressure 129/85, pulse 57, temperature 98.2 F (36.8 C), temperature source Oral, resp. rate 18, height 5\' 10"  (1.778 m), weight 84.823 kg (187 lb), SpO2 98.00%.Body mass index is 26.83 kg/(m^2).  General Appearance: Disheveled  Eye Contact::  Minimal  Speech:  Slow  Volume:  Decreased  Mood:  Hopeless and Irritable  Affect:  Flat  Thought Process:  Circumstantial  Orientation:  Full (Time, Place, and Person)  Thought Content:  Rumination  Suicidal Thoughts:  Yes.  without intent/plan  Homicidal Thoughts:  No  Memory:  Immediate;   Fair Recent;   Fair Remote;   Fair  Judgement:  Fair  Insight:  Present  Psychomotor Activity:  EPS  Concentration:  Fair  Recall:  Fair  Akathisia:  No  Handed:  Right  AIMS (if indicated):     Assets:  Social Support  Sleep:  Number of Hours: 6    Current Medications: Current Facility-Administered Medications  Medication Dose Route Frequency Provider Last Rate Last Dose  .  acetaminophen (TYLENOL) tablet 650 mg  650 mg Oral Q6H PRN Nanine Means, NP      . alum & mag hydroxide-simeth (MAALOX/MYLANTA) 200-200-20 MG/5ML suspension 30 mL  30 mL Oral Q4H PRN Nanine Means, NP      . divalproex (DEPAKOTE ER) 24 hr tablet 1,000 mg  1,000 mg Oral Daily Nanine Means, NP   1,000 mg at 04/04/12 1928  . divalproex (DEPAKOTE ER) 24 hr tablet 500 mg  500 mg Oral Daily Nanine Means, NP   500 mg at 04/05/12 0827  . docusate sodium (COLACE) capsule 100 mg  100 mg Oral BID Kerry Hough, PA   100 mg at 04/05/12 0827  . LORazepam (ATIVAN) tablet 1 mg  1 mg Oral Q6H PRN Nanine Means, NP      . magnesium hydroxide (MILK OF MAGNESIA) suspension 30 mL  30 mL Oral Daily PRN Nanine Means, NP      . OLANZapine (ZYPREXA) tablet 10 mg  10 mg Oral Daily Satia Winger, MD      . OLANZapine (ZYPREXA) tablet 20 mg  20 mg Oral QHS Nanine Means, NP   20 mg at 04/04/12 2129  . pantoprazole (PROTONIX) EC tablet 40 mg  40 mg Oral Daily Kerry Hough, PA   40 mg at 04/04/12 2230  . traZODone (DESYREL) tablet 100 mg  100 mg Oral QHS Nanine Means, NP        Lab Results:  Results for orders placed during the hospital encounter of 04/03/12 (from the past 48 hour(s))  CBC  Status: Normal   Collection Time   04/05/12  6:20 AM      Component Value Range Comment   WBC 5.8  4.0 - 10.5 K/uL    RBC 4.99  4.22 - 5.81 MIL/uL    Hemoglobin 15.2  13.0 - 17.0 g/dL    HCT 21.3  08.6 - 57.8 %    MCV 91.6  78.0 - 100.0 fL    MCH 30.5  26.0 - 34.0 pg    MCHC 33.3  30.0 - 36.0 g/dL    RDW 46.9  62.9 - 52.8 %    Platelets 171  150 - 400 K/uL   PROTIME-INR     Status: Normal   Collection Time   04/05/12  6:20 AM      Component Value Range Comment   Prothrombin Time 12.0  11.6 - 15.2 seconds    INR 0.89  0.00 - 1.49     Physical Findings: AIMS: Facial and Oral Movements Muscles of Facial Expression: None, normal Lips and Perioral Area: None, normal Jaw: None, normal Tongue: None, normal,Extremity  Movements Upper (arms, wrists, hands, fingers): None, normal Lower (legs, knees, ankles, toes): None, normal, Trunk Movements Neck, shoulders, hips: None, normal, Overall Severity Severity of abnormal movements (highest score from questions above): None, normal Incapacitation due to abnormal movements: None, normal Patient's awareness of abnormal movements (rate only patient's report): No Awareness, Dental Status Current problems with teeth and/or dentures?: No Does patient usually wear dentures?: No  CIWA:    COWS:     Treatment Plan Summary: Daily contact with patient to assess and evaluate symptoms and progress in treatment Medication management  Plan: Start Zyprexa at 10mg  po qam, first dose now. Continue other medications. Encouraged patient to not sleep in the day time, to take a shower. Patient agreeable with above plan. Try to obtain collateral information from his mother.  Medical Decision Making Problem Points:  Established problem, worsening (2), Review of last therapy session (1) and Review of psycho-social stressors (1) Data Points:  Review of medication regiment & side effects (2) Review of new medications or change in dosage (2)  I certify that inpatient services furnished can reasonably be expected to improve the patient's condition.   Shelba Susi 04/05/2012, 12:33 PM

## 2012-04-06 DIAGNOSIS — F3160 Bipolar disorder, current episode mixed, unspecified: Secondary | ICD-10-CM

## 2012-04-06 MED ORDER — DIVALPROEX SODIUM 500 MG PO DR TAB
1000.0000 mg | DELAYED_RELEASE_TABLET | Freq: Two times a day (BID) | ORAL | Status: DC
  Administered 2012-04-06 – 2012-04-12 (×13): 1000 mg via ORAL
  Filled 2012-04-06 (×15): qty 2

## 2012-04-06 NOTE — Clinical Social Work Note (Signed)
[facility] LCSW Group Therapy  Feelings Around Relapse 1:15 -2:20        04/06/2012  3:39 PM   Type of Therapy:  Group Therapy  Participation Level:  Patient did not attend group.   Wynn Banker 04/06/2012 3:39 PM

## 2012-04-06 NOTE — Progress Notes (Signed)
  D) Patient quiet and withdrawn upon my assessment. Patient becomes irritable and sullen with staff and times. Patient completed Patient Self Inventory, reports slept "poor," and  appetite is "poor." Patient rates depression as  10 /10, patient rates hopeless feelings as  10/10. Patient endorses SI, contracts verbally for safety with RN. Patient denies HI, denies A/V hallucinations.   A) Patient offered support and encouragement, patient encouraged to discuss feelings/concerns with staff. Patient verbalized understanding. Patient monitored Q15 minutes for safety. Patient met with MD  to discuss today's goals and plan of care.  R) Patient isolates to room, refuses to attend groups in day room but attends most meals in dining room. Patient irritable with staff and does not interact with peers.   Patient taking medications as ordered. Will continue to monitor.

## 2012-04-06 NOTE — Progress Notes (Signed)
Adult Psychoeducational Group Note  Date:  04/06/2012 Time:  3:13 PM  Group Topic/Focus:  Early Warning Signs:   The focus of this group is to help patients identify signs or symptoms they exhibit before slipping into an unhealthy state or crisis.  Participation Level:  None  Participation Quality:  n/a  Affect:  n/a  Cognitive:  n/a  Insight: None  Engagement in Group:  n/a  Modes of Intervention:  n/a  Additional Comments:  Pt refused to attend group.  Maeola Sarah 04/06/2012, 3:13 PM

## 2012-04-06 NOTE — Progress Notes (Signed)
[facility] MD Progress Note  04/06/2012 9:52 AM Matthew Mora  MRN:  [id number] Subjective:  Patient lying in bed , endorsing depressed mood and suicidal thoughts. Tolerating medications well, denies side effects. Continues to be anxious around people.  Diagnosis:   Axis I: Bipolar, mixed Axis II: Deferred Axis III:  Past Medical History  Diagnosis Date  . Bipolar 1 disorder    Axis IV: other psychosocial or environmental problems Axis V: 41-50 serious symptoms  ADL's:  Intact  Sleep: Fair  Appetite:  Fair   Psychiatric Specialty Exam: Review of Systems  Constitutional: Negative.   HENT: Negative.   Eyes: Negative.   Respiratory: Negative.   Cardiovascular: Negative.   Gastrointestinal: Negative.   Genitourinary: Negative.   Musculoskeletal: Negative.   Skin: Negative.   Neurological: Negative.   Endo/Heme/Allergies: Negative.   Psychiatric/Behavioral: Positive for depression and suicidal ideas. The patient is nervous/anxious and has insomnia.     Blood pressure 121/77, pulse 77, temperature 98.3 F (36.8 C), temperature source Oral, resp. rate 18, height 5\' 10"  (1.778 m), weight 84.823 kg (187 lb), SpO2 98.00%.Body mass index is 26.83 kg/(m^2).  General Appearance: Disheveled  Eye Contact::  Minimal  Speech:  Slurred  Volume:  Decreased  Mood:  Depressed and Dysphoric  Affect:  Depressed and Flat  Thought Process:  Circumstantial  Orientation:  Full (Time, Place, and Person)  Thought Content:  Rumination  Suicidal Thoughts:  Yes.  without intent/plan  Homicidal Thoughts:  No  Memory:  Immediate;   Fair Recent;   Fair Remote;   Fair  Judgement:  Poor  Insight:  Present  Psychomotor Activity:  Decreased  Concentration:  Fair  Recall:  Poor  Akathisia:  No  Handed:  Right  AIMS (if indicated):     Assets:  Housing  Sleep:  Number of Hours: 4.75    Current Medications: Current Facility-Administered Medications  Medication Dose Route Frequency Provider Last  Rate Last Dose  . acetaminophen (TYLENOL) tablet 650 mg  650 mg Oral Q6H PRN Nanine Means, NP      . alum & mag hydroxide-simeth (MAALOX/MYLANTA) 200-200-20 MG/5ML suspension 30 mL  30 mL Oral Q4H PRN Nanine Means, NP      . divalproex (DEPAKOTE ER) 24 hr tablet 1,000 mg  1,000 mg Oral Daily Nanine Means, NP   1,000 mg at 04/05/12 2221  . divalproex (DEPAKOTE ER) 24 hr tablet 500 mg  500 mg Oral Daily Nanine Means, NP   500 mg at 04/06/12 0804  . docusate sodium (COLACE) capsule 100 mg  100 mg Oral BID Kerry Hough, PA   100 mg at 04/06/12 4098  . LORazepam (ATIVAN) tablet 1 mg  1 mg Oral Q6H PRN Nanine Means, NP      . magnesium hydroxide (MILK OF MAGNESIA) suspension 30 mL  30 mL Oral Daily PRN Nanine Means, NP      . OLANZapine (ZYPREXA) tablet 10 mg  10 mg Oral Daily Oluwatimileyin Vivier, MD   10 mg at 04/06/12 0803  . OLANZapine (ZYPREXA) tablet 20 mg  20 mg Oral QHS Nanine Means, NP   20 mg at 04/05/12 2221  . pantoprazole (PROTONIX) EC tablet 40 mg  40 mg Oral Daily Kerry Hough, PA   40 mg at 04/06/12 1191  . traZODone (DESYREL) tablet 100 mg  100 mg Oral QHS Nanine Means, NP   100 mg at 04/05/12 2221    Lab Results:  Results for orders placed during the hospital  encounter of 04/03/12 (from the past 48 hour(s))  CBC     Status: Normal   Collection Time   04/05/12  6:20 AM      Component Value Range Comment   WBC 5.8  4.0 - 10.5 K/uL    RBC 4.99  4.22 - 5.81 MIL/uL    Hemoglobin 15.2  13.0 - 17.0 g/dL    HCT 16.1  09.6 - 04.5 %    MCV 91.6  78.0 - 100.0 fL    MCH 30.5  26.0 - 34.0 pg    MCHC 33.3  30.0 - 36.0 g/dL    RDW 40.9  81.1 - 91.4 %    Platelets 171  150 - 400 K/uL   PROTIME-INR     Status: Normal   Collection Time   04/05/12  6:20 AM      Component Value Range Comment   Prothrombin Time 12.0  11.6 - 15.2 seconds    INR 0.89  0.00 - 1.49     Physical Findings: AIMS: Facial and Oral Movements Muscles of Facial Expression: None, normal Lips and Perioral Area: None,  normal Jaw: None, normal Tongue: None, normal,Extremity Movements Upper (arms, wrists, hands, fingers): None, normal Lower (legs, knees, ankles, toes): None, normal, Trunk Movements Neck, shoulders, hips: None, normal, Overall Severity Severity of abnormal movements (highest score from questions above): None, normal Incapacitation due to abnormal movements: None, normal Patient's awareness of abnormal movements (rate only patient's report): No Awareness, Dental Status Current problems with teeth and/or dentures?: No Does patient usually wear dentures?: No  CIWA:    COWS:     Treatment Plan Summary: Daily contact with patient to assess and evaluate symptoms and progress in treatment Medication management  Plan: Increase Depakote to 1500mg  po qd. Obtain depakote level on Monday. Encourage patient to get out of bed, take a shower.  Medical Decision Making Problem Points:  Established problem, stable/improving (1), Review of last therapy session (1) and Review of psycho-social stressors (1) Data Points:  Review of medication regiment & side effects (2) Review of new medications or change in dosage (2)  I certify that inpatient services furnished can reasonably be expected to improve the patient's condition.   [name] 04/06/2012, 9:52 AM

## 2012-04-06 NOTE — Tx Team (Signed)
Interdisciplinary Treatment Plan Update (Adult)  Date:  04/06/2012  Time Reviewed:  10:40 AM   Progress in Treatment: Attending groups:   Yes   Participating in groups:  Yes Taking medication as prescribed:  Yes Tolerating medication:  Yes Family/Significant othe contact made: Patient is declining to allow family contact Patient understands diagnosis:  Yes Discussing patient identified problems/goals with staff: Yes Medical problems stabilized or resolved: Yes Denies suicidal/homicidal ideation:  No but contracts for safety Issues/concerns per patient self-inventory:  Other:   New problem(s) identified:  Reason for Continuation of Hospitalization: Anxiety Depression Medication stabilization Suicidal ideation  Interventions implemented related to continuation of hospitalization:  Medication Management; safety checks q 15 mins  Additional comments:  Estimated length of stay:  2-3 days  Discharge Plan:  Home with outpatient follow up  New goal(s):  Review of initial/current patient goals per problem list:    1.  Goal(s): Eliminate SI/other thoughts of self harm (Patient will no longer endorse SI/HI or thoughts of self harm)   Met:  No  Target date: d/c  As evidenced by: Patient continues to endorse SI and HI today    2.  Goal (s):Reduce depression/anxiety (will rate symptoms at ten today)  Met: No  Target date: d/c  As evidenced by: Patient is rating symptoms at ten today    3.  Goal(s):.stabilize on meds (Patient will report being stabilized on medications - less symptomatic)   Met:  No  Target date: d/c  As evidenced by: Patient is endorsing SI/HI and rates all symptoms at ten today    4.  Goal(s): Refer for outpatient follow up (Follow up appointment will be scheduled)   Met:  No  Target date: d/c  As evidenced by: Follow up appointment has not been scheduled    Attendees: Patient:   04/06/2012 10:40 AM  Physican:  Patrick North, MD  04/06/2012 10:40 AM  Nursing:  Harold Barban, RN 04/06/2012 10:40 AM   Nursing:   Berneice Heinrich, RN 04/06/2012 10:40 AM   Clinical Social Worker:  Juline Patch, LCSW 04/06/2012 10:40 AM   Other: Tera Helper, PHM-NP 04/06/2012 10:40 AM   Other:   04/06/2012 10:40 AM Other:        04/06/2012 10:40 AM

## 2012-04-06 NOTE — Progress Notes (Signed)
Patient ID: Matthew Mora, male   DOB: 21-Jul-1970, 42 y.o.   MRN: [id number]  D: Patient lying in bed sleeping on approach tonight. Woke him up for medication. The only time he was up tonight was to get something to drink. Still reports mood has not improved and has some passive SI and HI on and off. Reports feeling dizzy a lot and says that he has always been that way. Communication is minimal with staff tonight. A: Staff will monitor on q 15 minute checks, follow treatment plan, and give medications as ordered. R: Not attending tonight's group due to dizziness per patient. Took all medications without issue. Pleasant toward staff when given medication.

## 2012-04-07 NOTE — Clinical Social Work Psychosocial (Signed)
[facility] Group Notes: (Clinical Social Work)   04/07/2012      Type of Therapy:  Group Therapy   Participation Level:  Did Not Attend    Ambrose Mantle, LCSW 04/07/2012, 6:02 PM

## 2012-04-07 NOTE — Progress Notes (Signed)
Adult Psychoeducational Group Note  Date:  04/07/2012 Time:  0900  Group Topic/Focus:  Coping With Mental Health Crisis:   The purpose of this group is to help patients identify strategies for coping with mental health crisis.  Group discusses possible causes of crisis and ways to manage them effectively.  Participation Level:  Did Not Attend  Participation Quality:    Affect:    Cognitive:    Insight:   Engagement in Group:    Modes of Intervention:    Additional Comments:   Arturo Morton 04/07/2012, 10:26 AM

## 2012-04-07 NOTE — Progress Notes (Signed)
Patient ID: Matthew Mora, male   DOB: [birth year], 42 y.o.   MRN: [id number] [facility] MD Progress Note  04/07/2012 4:55 PM Marqus Macphee  MRN:  [id number] Subjective:   Patient lying in bed , endorsing depressed mood and suicidal thoughts. Tolerating medications well, denies side effects. Continues to be anxious around people.  Says that he sleeps irregularly and has to sleep when he can.   Diagnosis:   Axis I: Bipolar, mixed Axis II: Deferred Axis III:  Past Medical History  Diagnosis Date  . Bipolar 1 disorder    Axis IV: other psychosocial or environmental problems Axis V: 41-50 serious symptoms  ADL's:  Intact  Sleep: Fair  Appetite:  Fair   Psychiatric Specialty Exam: Review of Systems  Constitutional: Negative.   HENT: Negative.   Eyes: Negative.   Respiratory: Negative.   Cardiovascular: Negative.   Gastrointestinal: Negative.   Genitourinary: Negative.   Musculoskeletal: Negative.   Skin: Negative.   Neurological: Negative.   Endo/Heme/Allergies: Negative.   Psychiatric/Behavioral: Positive for depression and suicidal ideas. The patient is nervous/anxious and has insomnia.     Blood pressure 118/81, pulse 83, temperature 97.5 F (36.4 C), temperature source Oral, resp. rate 12, height 5\' 10"  (1.778 m), weight 84.823 kg (187 lb), SpO2 98.00%.Body mass index is 26.83 kg/(m^2).  General Appearance: Disheveled  Eye Contact::  Minimal  Speech:  Slurred  Volume:  Decreased  Mood:  Depressed and Dysphoric  Affect:  Depressed and Flat  Thought Process:  Circumstantial  Orientation:  Full (Time, Place, and Person)  Thought Content:  Rumination  Suicidal Thoughts:  Yes.  without intent/plan  Homicidal Thoughts:  No  Memory:  Immediate;   Fair Recent;   Fair Remote;   Fair  Judgement:  Poor  Insight:  Present  Psychomotor Activity:  Decreased  Concentration:  Fair  Recall:  Poor  Akathisia:  No  Handed:  Right  AIMS (if indicated):     Assets:  Housing    Sleep:  Number of Hours: 5.5    Current Medications: Current Facility-Administered Medications  Medication Dose Route Frequency Provider Last Rate Last Dose  . acetaminophen (TYLENOL) tablet 650 mg  650 mg Oral Q6H PRN Nanine Means, NP      . alum & mag hydroxide-simeth (MAALOX/MYLANTA) 200-200-20 MG/5ML suspension 30 mL  30 mL Oral Q4H PRN Nanine Means, NP      . divalproex (DEPAKOTE) DR tablet 1,000 mg  1,000 mg Oral Q12H Himabindu Ravi, MD   1,000 mg at 04/07/12 0844  . docusate sodium (COLACE) capsule 100 mg  100 mg Oral BID Kerry Hough, PA   100 mg at 04/07/12 1621  . LORazepam (ATIVAN) tablet 1 mg  1 mg Oral Q6H PRN Nanine Means, NP   1 mg at 04/07/12 1621  . magnesium hydroxide (MILK OF MAGNESIA) suspension 30 mL  30 mL Oral Daily PRN Nanine Means, NP      . OLANZapine (ZYPREXA) tablet 10 mg  10 mg Oral Daily Himabindu Ravi, MD   10 mg at 04/07/12 0844  . OLANZapine (ZYPREXA) tablet 20 mg  20 mg Oral QHS Nanine Means, NP   20 mg at 04/06/12 2145  . pantoprazole (PROTONIX) EC tablet 40 mg  40 mg Oral Daily Kerry Hough, PA   40 mg at 04/07/12 0843  . traZODone (DESYREL) tablet 100 mg  100 mg Oral QHS Nanine Means, NP   100 mg at 04/06/12 2145    Lab Results:  No results found for this or any previous visit (from the past 48 hour(s)).  Physical Findings: AIMS: Facial and Oral Movements Muscles of Facial Expression: None, normal Lips and Perioral Area: None, normal Jaw: None, normal Tongue: None, normal,Extremity Movements Upper (arms, wrists, hands, fingers): None, normal Lower (legs, knees, ankles, toes): None, normal, Trunk Movements Neck, shoulders, hips: None, normal, Overall Severity Severity of abnormal movements (highest score from questions above): None, normal Incapacitation due to abnormal movements: None, normal Patient's awareness of abnormal movements (rate only patient's report): No Awareness, Dental Status Current problems with teeth and/or dentures?:  No Does patient usually wear dentures?: No  CIWA:    COWS:     Treatment Plan Summary: Daily contact with patient to assess and evaluate symptoms and progress in treatment Medication management  Plan: Increase Depakote to 1500mg  po qd. Obtain depakote level on Monday. Encourage patient to get out of bed, take a shower.  Medical Decision Making Problem Points:  Established problem, stable/improving (1), Review of last therapy session (1) and Review of psycho-social stressors (1) Data Points:  Review of medication regiment & side effects (2) Review of new medications or change in dosage (2)  I certify that inpatient services furnished can reasonably be expected to improve the patient's condition.   Shantoria Ellwood,MICKIE D.  RPA-C CAQ-Psych  04/07/2012, 4:55 PM
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
## 2012-04-07 NOTE — Progress Notes (Signed)
Adult Psychoeducational Group Note  Date:  04/07/2012 Time:  2000  Group Topic/Focus:  Wrap-Up Group:   The focus of this group is to help patients review their daily goal of treatment and discuss progress on daily workbooks.  Participation Level:  Didn't attend   Participation Quality:    Affect:    Cognitive:    Insight:   Engagement in Group:    Modes of Intervention:    Additional Comments:  Pt didn't attend wrap-up group this evening.   Vincenzo Stave A 04/07/2012, 4:58 AM

## 2012-04-07 NOTE — Progress Notes (Signed)
D: Pt has been in bed entire time to time of writing. He did get up to come get his medications at the request of the Clinical research associate. He appeared flat and depressed. Guarded and brief with responses. At that time, he was c/o feeling drowsy and dizzy although he did get up and walk to the med room without appearing unsteady. He did not attend 0900 group and he did not complete his self inventory. He did endorse passive SI but contracted for safety. He denied HI and AVH.  A: Safety has been maintained with Q15 min observation. Support and encouragement provided. Medications and POC for the shift reviewed and understanding verbalized. Encouraged pt to complete his self inventory and attend unit programming.   R: Pt is calm and cooperative. He is not attending to unit programming. Will continue Q15 minute observation, continue to encourage unit and group programming and continue current POC.

## 2012-04-07 NOTE — Progress Notes (Signed)
Adult Psychoeducational Group Note  Date:  04/07/2012 Time:  1315  Group Topic/Focus:  Emotional Education:   The focus of this group is to discuss what feelings/emotions are, and how they are experienced.  Participation Level:  Did Not Attend  Participation Quality:    Affect:    Cognitive:    Insight:   Engagement in Group:    Modes of Intervention:    Additional Comments:    Arturo Morton 04/07/2012, 3:14 PM

## 2012-04-08 MED ORDER — TRAZODONE HCL 100 MG PO TABS
100.0000 mg | ORAL_TABLET | Freq: Every evening | ORAL | Status: DC | PRN
  Administered 2012-04-08 – 2012-04-14 (×4): 100 mg via ORAL
  Filled 2012-04-08 (×3): qty 1
  Filled 2012-04-08: qty 14
  Filled 2012-04-08 (×2): qty 1

## 2012-04-08 NOTE — Progress Notes (Signed)
Adult Psychoeducational Group Note  Date: 04/08/2012  Time: 1315  Group Topic/Focus: Support Systems  Participation Level: Did Not Attend   

## 2012-04-08 NOTE — Progress Notes (Signed)
[facility] Group Notes:  (Nursing/MHT/Case Management/Adjunct)  Date:  04/08/2012  Time:  10:45 PM  Type of Therapy:  Psychoeducational Skills  Participation Level:  None  Participation Quality:  Resistant  Affect:  Angry  Cognitive:  Lacking  Insight:  None  Engagement in Group:  None  Modes of Intervention:  Education  Summary of Progress/Problems:The patient attended group but refused to share.   Hazle Coca S 04/08/2012, 10:45 PM

## 2012-04-08 NOTE — Progress Notes (Signed)
D: Pt has been in bed much of the day today, pt has had minimal interactions or participation today. Pt appears flat and withdrawn and did not fill out pt self inventory. Pt did go to meals in cafeteria and has not verbalized any complaints today..  A: Safety has been maintained with Q15 min observation. Support and encouragement provided.   R:Will continue to monitor.

## 2012-04-08 NOTE — Progress Notes (Signed)
Psychoeducational Group Note  Date:  04/07/2012 Time: 2000  Group Topic/Focus:  Wrap-Up Group:   The focus of this group is to help patients review their daily goal of treatment and discuss progress on daily workbooks.  Participation Level: Did Not Attend  Participation Quality:  Not Applicable  Affect:  Not Applicable  Cognitive:  Not Applicable  Insight:  Not Applicable  Engagement in Group: Not Applicable  Additional Comments:  The pt. Was asleep in his bed .   Hazle Coca S 04/08/2012, 3:51 AM

## 2012-04-08 NOTE — Progress Notes (Signed)
Writer observed patient lying in bed resting and was easily aroused when Clinical research associate called his name. Patient has been in bed most of the evening reporting that he has been very sleepy and thinks it may be his medications. Writer informed patient of his scheduled hs medications and encouraged him to walk down to medication window and receive his medications and have a snack. Patient was cooperative and compliant with his medications. Patient denies hi/a/v hallucinations but does report passive si and verbally contracts for safety. Patient given snack, offered support and encouragement, safety maintained with 15 min checks, will continue to monitor.
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
## 2012-04-08 NOTE — Clinical Social Work Psychosocial (Signed)
[facility] Group Notes:  (Clinical Social Work)  04/08/2012   11:15-11:45AM  Summary of Progress/Problems:  The main focus of today's process group was to listen to a variety of genres of music and to identify that different types of music provoke different responses.  The patient then was able to identify personally what was soothing for them, as well as energizing.  Examples were given of how to use this knowledge in sleep habits, with depression, and with other symptoms.  The patient expressed very little during group, but did respond when directly asked by CSW.  He appeared severely depressed, was wrapped up in his blanket with his head over to one side as though resting.  Type of Therapy:  Music Therapy with processing done  Participation Level:  Minimal  Participation Quality:  Attentive and Drowsy  Affect:  Depressed and Flat  Cognitive:  Oriented  Insight:  Limited  Engagement in Therapy:  Improving  Modes of Intervention:   Socialization, Support and Processing, Exploration, Education, Rapport Building   [employer], LCSW 04/08/2012, 5:15 PM

## 2012-04-09 DIAGNOSIS — F3130 Bipolar disorder, current episode depressed, mild or moderate severity, unspecified: Principal | ICD-10-CM

## 2012-04-09 NOTE — Progress Notes (Signed)
[facility] LCSW Group Therapy Overcoming Obstacles 1:15 - 2:30 04/09/2012 3:33 PM  Type of Therapy:  Group Therapy  Participation Level:  Minimal  Participation Quality:  Appropriate  Affect:  Appropriate and Defensive  Cognitive:  Appropriate  Insight:  Lacking  Engagement in Therapy:  Lacking  Modes of Intervention:  Discussion, Exploration, Problem-solving, Rapport Building and Support  Summary of Progress/Problems:  Patient shared his obstacle is obtaining the medications he needs to remain stable.  He shared he has been out of work for almost two years and family is no longer helping him.  He stated he blamed his mother for his illness and that she should have gotten him help when he was a child.  Patient was challenged to considered whether it was he mother's fault that he was ill and to consider limited treatment option and mind sets from 30 years ago.  Wynn Banker 04/09/2012, 3:33 PM

## 2012-04-09 NOTE — Tx Team (Signed)
Interdisciplinary Treatment Plan Update (Adult)  Date:  04/09/2012  Time Reviewed:  9:55 AM   Progress in Treatment: Attending groups:   Yes   Participating in groups:  Yes Taking medication as prescribed:  Yes Tolerating medication:  Yes Family/Significant othe contact made: Patient is declining to allow family contact Patient understands diagnosis:  Yes Discussing patient identified problems/goals with staff: Yes Medical problems stabilized or resolved: Yes Denies suicidal/homicidal ideation:  No but contracts for safety Issues/concerns per patient self-inventory:  Other:   New problem(s) identified:  Reason for Continuation of Hospitalization: Anxiety Depression Medication stabilization Suicidal ideation  Interventions implemented related to continuation of hospitalization:  Medication Management; safety checks q 15 mins  Additional comments:  Estimated length of stay:  2-3 days  Discharge Plan:  Home with outpatient follow up  New goal(s):  Review of initial/current patient goals per problem list:    1.  Goal(s): Eliminate SI/other thoughts of self harm (Patient will no longer endorse SI/HI or thoughts of self harm)   Met:  No  Target date: d/c  As evidenced by: Patient continues to endorse SI and HI today    2.  Goal (s):Reduce depression/anxiety (will rate symptoms at ten today)  Met: No  Target date: d/c  As evidenced by: Patient continues to rate symptoms at ten today    3.  Goal(s):.stabilize on meds (Patient will report being stabilized on medications - less symptomatic)   Met:  No  Target date: d/c  As evidenced by: Patient continues to endorse SI/HI and rates all symptoms at ten today    4.  Goal(s): Refer for outpatient follow up (Follow up appointment will be scheduled)   Met:  No  Target date: d/c  As evidenced by: Follow up appointment has not been scheduled    Attendees: Patient:   04/09/2012 9:55 AM  Physican:   Patrick North, MD 04/09/2012 9:55 AM  Nursing:   Neill Loft, RN 04/09/2012 9:55 AM   Nursing:    Quintella Reichert, RN 04/09/2012 9:55 AM   Clinical Social Worker:  Juline Patch, LCSW 04/09/2012 9:55 AM   Other: Tera Helper, PHM-NP 04/09/2012 9:55 AM   Other:   04/09/2012 9:55 AM Other:        04/09/2012 9:55 AM

## 2012-04-09 NOTE — Progress Notes (Signed)
Adult Psychoeducational Group Note  Date:  04/09/2012 Time:  8:00PM  Group Topic/Focus:  Wrap-Up Group:   The focus of this group is to help patients review their daily goal of treatment and discuss progress on daily workbooks.  Participation Level:  Active  Participation Quality:  Appropriate  Affect:  Appropriate  Cognitive:  Alert and Oriented  Insight: Appropriate  Engagement in Group:  Developing/Improving  Modes of Intervention:  Clarification, Exploration, Problem-solving and Support  Additional Comments:   Pt rated his day as a 5. Pt stated that his goal is to remain on his medications and to get better.  Jacoria Keiffer, Randal Buba 04/09/2012, 10:19 PM

## 2012-04-09 NOTE — Progress Notes (Signed)
[facility] LCSW Aftercare Discharge Planning Group Note  04/09/2012 3:30 PM  Participation Quality:  Appropriate  Affect:  Appropriate, Blunted, Depressed and Flat  Cognitive:  Alert and Appropriate  Insight:  Defensive and Engaged  Engagement in Group:  Limited  Modes of Intervention:  Education, Exploration, Problem-solving and Support  Summary of Progress/Problems:  Patient continues to endorse SI but contracts for safety. He advised he is having racing thoughts and rates all symptoms at ten.  Patient shared he does not want anyone contacting his mother.  He will follow up with [facility] at discharge.  Wynn Banker 04/09/2012, 3:30 PM

## 2012-04-09 NOTE — Progress Notes (Signed)
Patient ID: Matthew Mora, male   DOB: Aug 26, 1970, 42 y.o.   MRN: [id number] D-  Patient reports poor sleep last night and poor appetite.  His energy level is low and his ability to pay attention is poor.  He is rating his depression and his hopelessness a 10.  He reports having thoughts of self harm off and on and he does contract for safety.  A- Talked with patient about hearing voices and he says he mainly hears them at night when he tries to sleep .R-  He says they say negative things to him.  He continues to c/o L shoulder pain and says he plans to talk with MD about something more effective than tylenol.

## 2012-04-09 NOTE — Progress Notes (Signed)
[facility] MD Progress Note  04/09/2012 1:50 PM Matthew Mora  MRN:  [id number] Subjective:  10/10 depression and anxiety, denies suicidal and homicidal ideations--remains angry.  When asked about whether he had talked to his mother who evicted him, he stated, "She knows what I am going through."  He applied for social security/disability last year and had to get a lawyer; does not have money to pay her for rent or assist in finances.  Matthew Mora said his feelings were hurt when she kicked him out and reacts by "going off".  He also stated, "I am not going to get a job no place because I will go off when someone pisses me off." Diagnosis:   Axis I: Bipolar, Depressed Axis II: Deferred Axis III:  Past Medical History  Diagnosis Date  . Bipolar 1 disorder    Axis IV: educational problems, housing problems, occupational problems, other psychosocial or environmental problems, problems related to social environment and problems with primary support group Axis V: 41-50 serious symptoms  ADL's:  Intact  Sleep: Fair  Appetite:  Fair  Suicidal Ideation:  Denies Homicidal Ideation:  Denies  Psychiatric Specialty Exam: Review of Systems  Constitutional: Negative.   HENT: Negative.   Eyes: Negative.   Respiratory: Negative.   Cardiovascular: Negative.   Gastrointestinal: Negative.   Genitourinary: Negative.   Musculoskeletal: Negative.   Skin: Negative.   Neurological: Negative.   Endo/Heme/Allergies: Negative.   Psychiatric/Behavioral: Positive for depression. The patient is nervous/anxious.     Blood pressure 109/67, pulse 102, temperature 97.6 F (36.4 C), temperature source Oral, resp. rate 18, height 5\' 10"  (1.778 m), weight 84.823 kg (187 lb), SpO2 98.00%.Body mass index is 26.83 kg/(m^2).  General Appearance: Casual  Eye Contact::  Fair  Speech:  Normal Rate  Volume:  Increased  Mood:  Angry, Anxious and Depressed  Affect:  Flat  Thought Process:  Coherent  Orientation:  Full (Time,  Place, and Person)  Thought Content:  WDL  Suicidal Thoughts:  No  Homicidal Thoughts:  No  Memory:  Immediate;   Fair Recent;   Fair Remote;   Fair  Judgement:  Fair  Insight:  Lacking  Psychomotor Activity:  Normal  Concentration:  Fair  Recall:  Fair  Akathisia:  No  Handed:  Right  AIMS (if indicated):     Assets:  Resilience  Sleep:  Number of Hours: 6.25    Current Medications: Current Facility-Administered Medications  Medication Dose Route Frequency Provider Last Rate Last Dose  . acetaminophen (TYLENOL) tablet 650 mg  650 mg Oral Q6H PRN Nanine Means, NP      . alum & mag hydroxide-simeth (MAALOX/MYLANTA) 200-200-20 MG/5ML suspension 30 mL  30 mL Oral Q4H PRN Nanine Means, NP      . divalproex (DEPAKOTE) DR tablet 1,000 mg  1,000 mg Oral Q12H Himabindu Ravi, MD   1,000 mg at 04/09/12 1191  . docusate sodium (COLACE) capsule 100 mg  100 mg Oral BID Kerry Hough, PA   100 mg at 04/09/12 4782  . LORazepam (ATIVAN) tablet 1 mg  1 mg Oral Q6H PRN Nanine Means, NP   1 mg at 04/07/12 1621  . magnesium hydroxide (MILK OF MAGNESIA) suspension 30 mL  30 mL Oral Daily PRN Nanine Means, NP      . OLANZapine (ZYPREXA) tablet 10 mg  10 mg Oral Daily Himabindu Ravi, MD   10 mg at 04/09/12 0841  . OLANZapine (ZYPREXA) tablet 20 mg  20 mg Oral QHS  Nanine Means, NP   20 mg at 04/08/12 2118  . pantoprazole (PROTONIX) EC tablet 40 mg  40 mg Oral Daily Kerry Hough, PA   40 mg at 04/09/12 0842  . traZODone (DESYREL) tablet 100 mg  100 mg Oral QHS PRN Fredrik Cove, PA-C   100 mg at 04/08/12 2118    Lab Results: No results found for this or any previous visit (from the past 48 hour(s)).  Physical Findings: AIMS: Facial and Oral Movements Muscles of Facial Expression: None, normal Lips and Perioral Area: None, normal Jaw: None, normal Tongue: None, normal,Extremity Movements Upper (arms, wrists, hands, fingers): None, normal Lower (legs, knees, ankles, toes): None, normal, Trunk  Movements Neck, shoulders, hips: None, normal, Overall Severity Severity of abnormal movements (highest score from questions above): None, normal Incapacitation due to abnormal movements: None, normal Patient's awareness of abnormal movements (rate only patient's report): No Awareness, Dental Status Current problems with teeth and/or dentures?: No Does patient usually wear dentures?: No  CIWA:    COWS:     Treatment Plan Summary: Daily contact with patient to assess and evaluate symptoms and progress in treatment Medication management  Plan:  Review of chart, vital signs, medications, and notes. 1-Estimated length of stay 1-3 days past his current stay of 6 2-Individual and group therapy encouraged 3-Medication management for mood instability to reduce current symptoms to base line and improve the patient's overall level of functioning:  Medications reviewed with the patient and he stated no untoward effects 4-Coping skills for depression, anger management, and anxiety developing-- 5-Continue crisis stabilization and management 6-Address health issues--monitoring blood pressures and other vital signs 7-Treatment plan in progress to prevent relapse of depression, anger management, and anxiety 8-Psychosocial education regarding relapse prevention and self-care  Medical Decision Making Problem Points:  Established problem, stable/improving (1) and Review of psycho-social stressors (1) Data Points:  Review of medication regiment & side effects (2)  I certify that inpatient services furnished can reasonably be expected to improve the patient's condition.   Nanine Means, PMH-NP 04/09/2012, 1:50 PM

## 2012-04-10 NOTE — Progress Notes (Signed)
[facility] LCSW Aftercare Discharge Planning Group Note  04/10/2012 11:21 AM  Participation Quality: NOne  Affect:  Appropriate, Blunted, Depressed and Flat, Irritable  Cognitive:Alert  Insight:  Defensive  Engagement in Group: None  Modes of Intervention:  Education, Exploration, Problem-solving and Support  Summary of Progress/Problems:  Patient refused to answer any questions but remained in group.  Wynn Banker 04/10/2012, 11:21 AM

## 2012-04-10 NOTE — Progress Notes (Signed)
Reviewed

## 2012-04-10 NOTE — Progress Notes (Signed)
[name] MD Progress Note  04/10/2012 10:26 AM Matthew Mora  MRN:  [id number] Subjective:  Patient states he was able to attend group this morning. Continues to be irritable on the unit. Compliant with his medications.  Diagnosis:   Axis I: Bipolar, mixed Axis II: Deferred Axis III:  Past Medical History  Diagnosis Date  . Bipolar 1 disorder    Axis IV: housing problems and other psychosocial or environmental problems Axis V: 41-50 serious symptoms  ADL's:  Intact  Sleep: Fair  Appetite:  Fair   Psychiatric Specialty Exam: Review of Systems  Constitutional: Negative.   HENT: Negative.   Eyes: Negative.   Respiratory: Negative.   Cardiovascular: Negative.   Gastrointestinal: Negative.   Genitourinary: Negative.   Musculoskeletal: Negative.   Skin: Negative.   Neurological: Negative.   Endo/Heme/Allergies: Negative.   Psychiatric/Behavioral: Positive for depression and suicidal ideas. The patient is nervous/anxious.     Blood pressure 114/74, pulse 86, temperature 97.6 F (36.4 C), temperature source Oral, resp. rate 18, height 5\' 10"  (1.778 m), weight 84.823 kg (187 lb), SpO2 98.00%.Body mass index is 26.83 kg/(m^2).  General Appearance: Disheveled  Eye Contact::  Minimal  Speech:  Slurred  Volume:  Decreased  Mood:  Dysphoric and Irritable  Affect:  Constricted  Thought Process:  Tangential  Orientation:  Full (Time, Place, and Person)  Thought Content:  WDL  Suicidal Thoughts:  Yes.  without intent/plan  Homicidal Thoughts:  No  Memory:  Immediate;   Fair Recent;   Fair Remote;   Fair  Judgement:  Fair  Insight:  Fair  Psychomotor Activity:  Decreased  Concentration:  Fair  Recall:  Fair  Akathisia:  No  Handed:  Right  AIMS (if indicated):     Assets:  Desire for Improvement  Sleep:  Number of Hours: 6.75    Current Medications: Current Facility-Administered Medications  Medication Dose Route Frequency Provider Last Rate Last Dose  . acetaminophen  (TYLENOL) tablet 650 mg  650 mg Oral Q6H PRN Nanine Means, NP      . alum & mag hydroxide-simeth (MAALOX/MYLANTA) 200-200-20 MG/5ML suspension 30 mL  30 mL Oral Q4H PRN Nanine Means, NP      . divalproex (DEPAKOTE) DR tablet 1,000 mg  1,000 mg Oral Q12H Valkyrie Guardiola, MD   1,000 mg at 04/10/12 0729  . docusate sodium (COLACE) capsule 100 mg  100 mg Oral BID Kerry Hough, PA   100 mg at 04/10/12 0729  . LORazepam (ATIVAN) tablet 1 mg  1 mg Oral Q6H PRN Nanine Means, NP   1 mg at 04/07/12 1621  . magnesium hydroxide (MILK OF MAGNESIA) suspension 30 mL  30 mL Oral Daily PRN Nanine Means, NP      . OLANZapine (ZYPREXA) tablet 10 mg  10 mg Oral Daily Armenta Erskin, MD   10 mg at 04/10/12 0729  . OLANZapine (ZYPREXA) tablet 20 mg  20 mg Oral QHS Nanine Means, NP   20 mg at 04/09/12 2144  . pantoprazole (PROTONIX) EC tablet 40 mg  40 mg Oral Daily Kerry Hough, PA   40 mg at 04/10/12 0729  . traZODone (DESYREL) tablet 100 mg  100 mg Oral QHS PRN Fredrik Cove, PA-C   100 mg at 04/08/12 2118    Lab Results: No results found for this or any previous visit (from the past 48 hour(s)).  Physical Findings: AIMS: Facial and Oral Movements Muscles of Facial Expression: None, normal Lips and Perioral Area:  None, normal Jaw: None, normal Tongue: None, normal,Extremity Movements Upper (arms, wrists, hands, fingers): None, normal Lower (legs, knees, ankles, toes): None, normal, Trunk Movements Neck, shoulders, hips: None, normal, Overall Severity Severity of abnormal movements (highest score from questions above): None, normal Incapacitation due to abnormal movements: None, normal Patient's awareness of abnormal movements (rate only patient's report): No Awareness, Dental Status Current problems with teeth and/or dentures?: No Does patient usually wear dentures?: No  CIWA:    COWS:     Treatment Plan Summary: Daily contact with patient to assess and evaluate symptoms and progress in  treatment Medication management  Plan: Adjust medications accordingly. Encourage patient to get up and shower. Continue to monitor.  Medical Decision Making Problem Points:  Established problem, stable/improving (1), Review of last therapy session (1) and Review of psycho-social stressors (1) Data Points:  Review of medication regiment & side effects (2) Review of new medications or change in dosage (2)  I certify that inpatient services furnished can reasonably be expected to improve the patient's condition.   Ellan Tess 04/10/2012, 10:26 AM

## 2012-04-10 NOTE — Progress Notes (Signed)
Patient ID: Matthew Mora, male   DOB: 07-22-1970, 42 y.o.   MRN: [id number] D: Pt. Reports "I'm ready to go home they done pissed me off in here, talking bout they couldn't bring me dinner. I know they could have brought me some food back"  Pt. Reports group been positive says "its how we perceive things before they happen"  A: Writer encouraged pt. To attend tonight's group. Staff will monitor q42min for safety. R: Pt. Did attend group and participated. Pt is safe on the unit.

## 2012-04-10 NOTE — Progress Notes (Signed)
[facility] LCSW Group Therapy Feelings Around Diagnosis 1:15 - 2:30 04/10/2012 2:57 PM  Type of Therapy:  Group Therapy  Participation Level:  Did not attend group. Wynn Banker 04/10/2012, 2:57 PM

## 2012-04-10 NOTE — Progress Notes (Signed)
Psychoeducational Group Note  Date:  04/10/2012 Time:  1100  Group Topic/Focus:  Recovery Goals:   The focus of this group is to identify appropriate goals for recovery and establish a plan to achieve them.  Participation Level: Did Not Attend  Participation Quality:  Not Applicable  Affect:  Not Applicable  Cognitive:  Not Applicable  Insight:  Not Applicable  Engagement in Group: Not Applicable  Additional Comments:  Patient did not attend group, patient remained in bed.  Karleen Hampshire Brittini 04/10/2012, 3:16 PM

## 2012-04-10 NOTE — Progress Notes (Signed)
Patient ID: Matthew Mora, male   DOB: 07/04/1970, 42 y.o.   MRN: [id number] D- Patient reports fair sleep last night and good appetite.  His energy level is normal and his ability to pay attention is improving.  He rates his depression a 5 and his hopelessness a 5.  He denies SI today .  He did not attend discharge planning group and says he has an issue with group leader. A- Talked with him about speaking with leader about his concerns .  R- patient says he will think about this.  He has been sleeping a lot today and did not go to lunch. He is scheduled for a valporic acid level today.

## 2012-04-11 NOTE — Progress Notes (Signed)
[facility] LCSW Aftercare Discharge Planning Group Note  04/11/2012 12:19 PM  Participation Quality:  Appropriate  Affect:  Appropriate  Cognitive:  Alert and Appropriate  Insight:  Improving  Engagement in Group:  Improving  Modes of Intervention:  Education, Exploration, Problem-solving and Support  Summary of Progress/Problems:  Patient reports being much better today.  He denies SI/HI and rates depression and all symptoms at four.  He advised of interest in being referred to [facility] at discharge.  Referral to be made as requested.  Wynn Banker 04/11/2012, 12:19 PM

## 2012-04-11 NOTE — Progress Notes (Signed)
Patient ID: Matthew Mora, male   DOB: [birth year], 42 y.o.   MRN: [id number] [facility] MD Progress Note  04/11/2012 3:22 PM Matthew Mora  MRN:  [id number]  Subjective:  "I feel a lot better today. I just have a little bit of indigestion. My depression today is at #4. I talked to my mother today, she is not as upset with me as she was. She wants me to get a job. But I can't find one. Working 30 hours a week is not going to help much. There is not much money to make working that kind of hours. I'm waiting for my disability application to be approved. Then I will be collecting the benefits. I am not suicidal or homicidal today".  Diagnosis:   Axis I: Bipolar, mixed Axis II: Deferred Axis III:  Past Medical History  Diagnosis Date  . Bipolar 1 disorder    Axis IV: housing problems and other psychosocial or environmental problems Axis V: 41-50 serious symptoms  ADL's:  Intact  Sleep: Fair  Appetite:  Fair   Psychiatric Specialty Exam: Review of Systems  Constitutional: Negative.   HENT: Negative.   Eyes: Negative.   Respiratory: Negative.   Cardiovascular: Negative.   Gastrointestinal: Negative.   Genitourinary: Negative.   Musculoskeletal: Negative.   Skin: Negative.   Neurological: Negative.   Endo/Heme/Allergies: Negative.   Psychiatric/Behavioral: Positive for depression and suicidal ideas. The patient is nervous/anxious.     Blood pressure 121/81, pulse 81, temperature 98.4 F (36.9 C), temperature source Oral, resp. rate 20, height 5\' 10"  (1.778 m), weight 84.823 kg (187 lb), SpO2 98.00%.Body mass index is 26.83 kg/(m^2).  General Appearance: Disheveled  Eye Contact::  Minimal  Speech:  Clear  Volume:  Decreased  Mood:  "I'm feeling better today"  Affect:  Constricted  Thought Process:  Tangential  Orientation:  Full (Time, Place, and Person)  Thought Content:  Denies hallucinations, delusions and or paranoia.  Suicidal Thoughts:  No  Homicidal Thoughts:  No    Memory:  Immediate;   Fair Recent;   Fair Remote;   Fair  Judgement:  Fair  Insight:  Fair  Psychomotor Activity:  Decreased  Concentration:  Fair  Recall:  Fair  Akathisia:  No  Handed:  Right  AIMS (if indicated):     Assets:  Desire for Improvement  Sleep:  Number of Hours: 5.25    Current Medications: Current Facility-Administered Medications  Medication Dose Route Frequency Provider Last Rate Last Dose  . acetaminophen (TYLENOL) tablet 650 mg  650 mg Oral Q6H PRN Nanine Means, NP      . alum & mag hydroxide-simeth (MAALOX/MYLANTA) 200-200-20 MG/5ML suspension 30 mL  30 mL Oral Q4H PRN Nanine Means, NP      . divalproex (DEPAKOTE) DR tablet 1,000 mg  1,000 mg Oral Q12H Himabindu Ravi, MD   1,000 mg at 04/11/12 0818  . docusate sodium (COLACE) capsule 100 mg  100 mg Oral BID Kerry Hough, PA   100 mg at 04/11/12 0817  . LORazepam (ATIVAN) tablet 1 mg  1 mg Oral Q6H PRN Nanine Means, NP   1 mg at 04/07/12 1621  . magnesium hydroxide (MILK OF MAGNESIA) suspension 30 mL  30 mL Oral Daily PRN Nanine Means, NP      . OLANZapine (ZYPREXA) tablet 10 mg  10 mg Oral Daily Himabindu Ravi, MD   10 mg at 04/11/12 0818  . OLANZapine (ZYPREXA) tablet 20 mg  20 mg Oral QHS Nanine Means,  NP   20 mg at 04/10/12 2110  . pantoprazole (PROTONIX) EC tablet 40 mg  40 mg Oral Daily Kerry Hough, PA   40 mg at 04/11/12 0817  . traZODone (DESYREL) tablet 100 mg  100 mg Oral QHS PRN Fredrik Cove, PA-C   100 mg at 04/08/12 2118    Lab Results:  Results for orders placed during the hospital encounter of 04/03/12 (from the past 48 hour(s))  VALPROIC ACID LEVEL     Status: Abnormal   Collection Time   04/10/12  8:06 PM      Component Value Range Comment   Valproic Acid Lvl 114.1 (*) 50.0 - 100.0 ug/mL     Physical Findings: AIMS: Facial and Oral Movements Muscles of Facial Expression: None, normal Lips and Perioral Area: None, normal Jaw: None, normal Tongue: None, normal,Extremity  Movements Upper (arms, wrists, hands, fingers): None, normal Lower (legs, knees, ankles, toes): None, normal, Trunk Movements Neck, shoulders, hips: None, normal, Overall Severity Severity of abnormal movements (highest score from questions above): None, normal Incapacitation due to abnormal movements: None, normal Patient's awareness of abnormal movements (rate only patient's report): No Awareness, Dental Status Current problems with teeth and/or dentures?: No Does patient usually wear dentures?: No  CIWA:    COWS:     Treatment Plan Summary: Daily contact with patient to assess and evaluate symptoms and progress in treatment Medication management  Plan: Supportive approach/coping skills. Encouraged out of room, participation in group sessions and application of coping skills when distressed. Will continue to monitor response to/adverse effects of medications in use to assure effectiveness. Continue to monitor mood, behavior and interaction with staff and other patients. Continue current plan of care.   Medical Decision Making Problem Points:  Established problem, stable/improving (1), Review of last therapy session (1) and Review of psycho-social stressors (1) Data Points:  Review of medication regiment & side effects (2) Review of new medications or change in dosage (2)  I certify that inpatient services furnished can reasonably be expected to improve the patient's condition.   Armandina Stammer I 04/11/2012, 3:22 PM

## 2012-04-11 NOTE — Progress Notes (Signed)
Adult Psychoeducational Group Note  Date:  04/10/2012 Time:  2000  Group Topic/Focus:  Wrap-up  Participation Level:  Active  Participation Quality:  Appropriate and Attentive  Affect:  Appropriate  Cognitive:  Alert and Appropriate  Insight: Appropriate and Good  Engagement in Group:  Engaged  Modes of Intervention:  Support  Additional Comments:  Pt stated that he had achieved his goal of "coming out more" (i.e., leaving his room, going to groups.)  Matthew Mora Monique 04/11/2012, 12:24 AM

## 2012-04-11 NOTE — Progress Notes (Signed)
Patient ID: Matthew Mora, male   DOB: 29-Jun-1970, 42 y.o.   MRN: [id number]  D: Patient with bright affect, dancing in the halls and laughing at times. Pt interacting well with peers. A: Monitor Q 15 minutes for safety, encourage staff/peer interaction and group participation. Administer medications as ordered by MD. R: Pt denies SI or plans to harm himself.

## 2012-04-11 NOTE — Progress Notes (Signed)
Adult Psychoeducational Group Note  Date:  04/11/2012 Time:  8:59 PM  Group Topic/Focus:  Wrap-Up Group:   The focus of this group is to help patients review their daily goal of treatment and discuss progress on daily workbooks.  Participation Level:  Active  Participation Quality:  Attentive  Affect:  Appropriate  Cognitive:  Alert  Insight: Appropriate  Engagement in Group:  Engaged  Modes of Intervention:  Education  Additional Comments:  Pt stated that he had a great day. He stated that he has realized that he cannot do this alone and he has to start allowing others to help him, so today he started to attend groups.  Kaleen Odea R 04/11/2012, 8:59 PM

## 2012-04-11 NOTE — Progress Notes (Signed)
Adult Psychoeducational Group Note  Date:  04/11/2012 Time:  12:09 PM  Group Topic/Focus:  Crisis Planning:   The purpose of this group is to help patients create a crisis plan for use upon discharge or in the future, as needed.  Participation Level:  None Pt did not speak or interact with other in group  Participation Quality:  Drowsy. Pt did not speak but had his eyes open.  Affect:  Depressed  Cognitive:  did not speak in group. Unable to access cognitive ability  Insight: None  Engagement in Group:  None  Modes of Intervention:  Discussion and Support  Additional Comments:  Pt was awake during group but did not speak or interact with others.   Raven Furnas T 04/11/2012, 12:09 PM

## 2012-04-11 NOTE — Progress Notes (Signed)
Patient ID: Matthew Mora, male   DOB: May 01, 1970, 42 y.o.   MRN: [id number] D: Pt. In dayroom watching TV, interacting. Pt. Affect bright, reports no complaints. Pt. Reports groups been helpful, excited that he will be going to [employer]. Pt. Reports  still awaiting disability. A: Pt. Will be monitored q74min for safety. Staff encouraged group. R: Pt. Attended group and participated. Pt. Is safe on the unit.

## 2012-04-11 NOTE — Progress Notes (Signed)
[facility] LCSW Group Therapy  Emotional Regulation  1:15 - 2:30 04/11/2012 3:17 PM  Type of Therapy:  Group Therapy  Participation Level:  Did not attend group. Wynn Banker 04/11/2012, 3:17 PM

## 2012-04-11 NOTE — Progress Notes (Signed)
D: Patient denies SI/HI and A/V hallucinations; patient reports sleep to be well; reports appetite to be good; reports energy level is normal ; reports ability to pay attention is improving; rates depression as 4/10; rates hopelessness 4/10; rates anxiety as 1/10;   A: Monitored q 15 minutes; patient encouraged to attend groups; patient educated about medications; patient given medications per physician orders; patient encouraged to express feelings and/or concerns  R: Patient is animated and bright on the unit; patient's interaction with staff and peers is appropriate ;  patient is taking medications as prescribed and tolerating medications; patient is attending all groups

## 2012-04-11 NOTE — Progress Notes (Deleted)
[facility] INPATIENT:  Family/Significant Other Suicide Prevention Education  Suicide Prevention Education:  Education Completed; Jacolyn Reedy, Friend, [phone], has been identified by the patient as the family member/significant other with whom the patient will be residing, and identified as the person(s) who will aid the patient in the event of a mental health crisis (suicidal ideations/suicide attempt).  With written consent from the patient, the family member/significant other has been provided the following suicide prevention education, prior to the and/or following the discharge of the patient.  The suicide prevention education provided includes the following:  Suicide risk factors  Suicide prevention and interventions  National Suicide Hotline telephone number  [facility] assessment telephone number  [facility] Emergency Assistance 911  [facility] and/or Residential Mobile Crisis Unit telephone number  Request made of family/significant other to:  Remove weapons (e.g., guns, rifles, knives), all items previously/currently identified as safety concern.  Boyfriend reports gun is secured.  Remove drugs/medications (over-the-counter, prescriptions, illicit drugs), all items previously/currently identified as a safety concern.  The family member/significant other verbalizes understanding of the suicide prevention education information provided.  The family member/significant other agrees to remove the items of safety concern listed above.  Wynn Banker 04/11/2012, 3:20 PM

## 2012-04-12 MED ORDER — DIVALPROEX SODIUM 500 MG PO DR TAB
500.0000 mg | DELAYED_RELEASE_TABLET | Freq: Three times a day (TID) | ORAL | Status: DC
  Administered 2012-04-12 – 2012-04-16 (×11): 500 mg via ORAL
  Filled 2012-04-12: qty 1
  Filled 2012-04-12 (×2): qty 42
  Filled 2012-04-12: qty 1
  Filled 2012-04-12: qty 42
  Filled 2012-04-12 (×3): qty 1
  Filled 2012-04-12: qty 42
  Filled 2012-04-12 (×5): qty 1
  Filled 2012-04-12: qty 42
  Filled 2012-04-12 (×2): qty 1
  Filled 2012-04-12: qty 42
  Filled 2012-04-12: qty 1

## 2012-04-12 NOTE — Progress Notes (Signed)
Psychoeducational Group Note  Date:  04/12/2012 Time:  1100   Group Topic/Focus:  Rediscovering Joy:   The focus of this group is to explore various ways to relieve stress in a positive manner.  Participation Level: Did Not Attend  Participation Quality:  Not Applicable  Affect:  Not Applicable  Cognitive:  Not Applicable  Insight:  Not Applicable  Engagement in Group: Not Applicable  Additional Comments:  Patient encouraged to attend group by staff.   Noah Charon 04/12/2012, 12:09 PM

## 2012-04-12 NOTE — Progress Notes (Signed)
Patient ID: Matthew Mora, male   DOB: Oct 06, 1970, 42 y.o.   MRN: [id number] D: Pt. Reports day "been good". Pt. Talks about dinner menu "it was good". Pt. Hopeful about discharge on Monday. A: Staff will monitor q24min for safety. Writer encouraged group. R: Pt is safe on the unit. Pt. Attends karaoke.

## 2012-04-12 NOTE — Progress Notes (Signed)
Psychoeducational Group Note  Date:  04/12/2012 Time:  1000  Group Topic/Focus:  Therapeutic Activity  Participation Level: Did Not Attend  Participation Quality:  Not Applicable  Affect:  Not Applicable  Cognitive:  Not Applicable  Insight:  Not Applicable  Engagement in Group: Not Applicable  Additional Comments:  Patient did not attend, patient stated "he did not want to attend." Patient encouraged to join group. Patient remained in bed. Karleen Hampshire Brittini 04/12/2012, 7:04 PM

## 2012-04-12 NOTE — Progress Notes (Signed)
Adult Psychoeducational Group Note  Date:  04/12/2012 Time:  2000  Group Topic/Focus:  Karaoke  Participation Level:  Minimal  Participation Quality:  Appropriate and Attentive  Affect:  Appropriate  Cognitive:  Alert and Appropriate  Insight: Good  Engagement in Group:  Engaged  Modes of Intervention:  Socialization  Additional Comments:    Humberto Seals Monique 04/12/2012, 11:35 PM

## 2012-04-12 NOTE — Progress Notes (Signed)
[facility] LCSW Group Therapy  [facility] 1:15 - 2:30 04/12/2012 3:43 PM  Type of Therapy:  Group Therapy  Participation Level:  Did not attend group.  Wynn Banker 04/12/2012, 3:43 PM

## 2012-04-12 NOTE — Progress Notes (Signed)
[facility] MD Progress Note  04/12/2012 12:49 PM Matthew Mora  MRN:  [id number] Subjective:  Patient lying in bed with blanket over head. Continues to be irritable, reports depressed mood.  Diagnosis:   Axis I: Bipolar, mixed Axis II: Deferred Axis III:  Past Medical History  Diagnosis Date  . Bipolar 1 disorder    Axis IV: housing problems, occupational problems and other psychosocial or environmental problems Axis V: 41-50 serious symptoms  ADL's:  Impaired  Sleep: Fair  Appetite:  Fair    Psychiatric Specialty Exam: Review of Systems  Constitutional: Negative.   HENT: Negative.   Eyes: Negative.   Respiratory: Negative.   Cardiovascular: Negative.   Gastrointestinal: Negative.   Genitourinary: Negative.   Musculoskeletal: Negative.   Skin: Negative.   Neurological: Negative.   Endo/Heme/Allergies: Negative.   Psychiatric/Behavioral: Positive for depression. The patient is nervous/anxious.     Blood pressure 122/72, pulse 98, temperature 98.1 F (36.7 C), temperature source Oral, resp. rate 16, height 5\' 10"  (1.778 m), weight 84.823 kg (187 lb), SpO2 98.00%.Body mass index is 26.83 kg/(m^2).  General Appearance: Disheveled  Eye Contact::  Minimal  Speech:  Garbled  Volume:  Decreased  Mood:  Depressed, Hopeless and Irritable  Affect:  Constricted and Depressed  Thought Process:  Circumstantial  Orientation:  Full (Time, Place, and Person)  Thought Content:  Rumination  Suicidal Thoughts:  Yes.  without intent/plan  Homicidal Thoughts:  No  Memory:  Immediate;   Fair Recent;   Fair Remote;   Fair  Judgement:  Fair  Insight:  Fair  Psychomotor Activity:  Decreased  Concentration:  Fair  Recall:  Fair  Akathisia:  No  Handed:  Right  AIMS (if indicated):     Assets:  Social Support  Sleep:  Number of Hours: 6.5    Current Medications: Current Facility-Administered Medications  Medication Dose Route Frequency Provider Last Rate Last Dose  . acetaminophen  (TYLENOL) tablet 650 mg  650 mg Oral Q6H PRN Nanine Means, NP      . alum & mag hydroxide-simeth (MAALOX/MYLANTA) 200-200-20 MG/5ML suspension 30 mL  30 mL Oral Q4H PRN Nanine Means, NP      . divalproex (DEPAKOTE) DR tablet 1,000 mg  1,000 mg Oral Q12H Erionna Strum, MD   1,000 mg at 04/12/12 0814  . docusate sodium (COLACE) capsule 100 mg  100 mg Oral BID Kerry Hough, PA   100 mg at 04/12/12 0815  . LORazepam (ATIVAN) tablet 1 mg  1 mg Oral Q6H PRN Nanine Means, NP   1 mg at 04/07/12 1621  . magnesium hydroxide (MILK OF MAGNESIA) suspension 30 mL  30 mL Oral Daily PRN Nanine Means, NP      . OLANZapine (ZYPREXA) tablet 10 mg  10 mg Oral Daily Tawania Daponte, MD   10 mg at 04/12/12 0815  . OLANZapine (ZYPREXA) tablet 20 mg  20 mg Oral QHS Nanine Means, NP   20 mg at 04/11/12 2145  . pantoprazole (PROTONIX) EC tablet 40 mg  40 mg Oral Daily Kerry Hough, PA   40 mg at 04/12/12 0815  . traZODone (DESYREL) tablet 100 mg  100 mg Oral QHS PRN Fredrik Cove, PA-C   100 mg at 04/08/12 2118    Lab Results:  Results for orders placed during the hospital encounter of 04/03/12 (from the past 48 hour(s))  VALPROIC ACID LEVEL     Status: Abnormal   Collection Time   04/10/12  8:06 PM  Component Value Range Comment   Valproic Acid Lvl 114.1 (*) 50.0 - 100.0 ug/mL     Physical Findings: AIMS: Facial and Oral Movements Muscles of Facial Expression: None, normal Lips and Perioral Area: None, normal Jaw: None, normal Tongue: None, normal,Extremity Movements Upper (arms, wrists, hands, fingers): None, normal Lower (legs, knees, ankles, toes): None, normal, Trunk Movements Neck, shoulders, hips: None, normal, Overall Severity Severity of abnormal movements (highest score from questions above): None, normal Incapacitation due to abnormal movements: None, normal Patient's awareness of abnormal movements (rate only patient's report): No Awareness, Dental Status Current problems with teeth  and/or dentures?: No Does patient usually wear dentures?: No  CIWA:    COWS:     Treatment Plan Summary: Daily contact with patient to assess and evaluate symptoms and progress in treatment Medication management  Plan: Patient`s depakote level elevated at 114. Reduce depakote dosage to 1500mg  po qd. Continue to monitor symptoms. Patient encouraged to attend groups.  Medical Decision Making Problem Points:  Established problem, stable/improving (1), Review of last therapy session (1) and Review of psycho-social stressors (1) Data Points:  Review of medication regiment & side effects (2) Review of new medications or change in dosage (2)  I certify that inpatient services furnished can reasonably be expected to improve the patient's condition.   Jakalyn Kratky 04/12/2012, 12:49 PM

## 2012-04-12 NOTE — Progress Notes (Signed)
[facility] LCSW Aftercare Discharge Planning Group Note  04/12/2012 12:25 PM  Participation Quality:  Appropriate  Affect:  Appropriate  Cognitive:  Alert and Appropriate  Insight:  Improving  Engagement in Group:  Improving  Modes of Intervention:  Education, Exploration, Problem-solving and Support  Summary of Progress/Problems:  Patient reports being much better today.  He denies SI/HI and rates depression three.  He reports being excited about getting into [employer].  Patient gave consent for contact with mother.    Wynn Banker 04/12/2012, 12:25 PM

## 2012-04-12 NOTE — Progress Notes (Signed)
  D) Patient pleasant and cooperative upon my assessment. Patient states "I don't know why but I am so sleepy today I just want to sleep in."  Patient completed Patient Self Inventory, reports slept "fair," and  appetite is "improving." Patient rates depression as   4/10, patient rates hopeless feelings as 7 /10. Patient denies SI/HI, denies A/V hallucinations.   A) Patient offered support and encouragement, patient encouraged to discuss feelings/concerns with staff. Patient verbalized understanding. Patient monitored Q15 minutes for safety. Patient met with MD  to discuss today's goals and plan of care.  R) Patient visible in milieu, attending groups in day room and meals in dining room. Patient appropriate with staff and peers.   Patient taking medications as ordered. Will continue to monitor.

## 2012-04-13 DIAGNOSIS — F411 Generalized anxiety disorder: Secondary | ICD-10-CM

## 2012-04-13 DIAGNOSIS — F1994 Other psychoactive substance use, unspecified with psychoactive substance-induced mood disorder: Secondary | ICD-10-CM

## 2012-04-13 DIAGNOSIS — F1910 Other psychoactive substance abuse, uncomplicated: Secondary | ICD-10-CM

## 2012-04-13 MED ORDER — PROMETHAZINE HCL 25 MG PO TABS: 25.0000 mg | ORAL_TABLET | Freq: Four times a day (QID) | ORAL | Status: DC | PRN

## 2012-04-13 NOTE — Progress Notes (Signed)
[facility] LCSW Group Therapy  Feelings Around Relapse 1:15 - 2:30 04/13/2012 3:25 PM  Type of Therapy:  Group Therapy  Participation Level:  Did not attend group.  Wynn Banker 04/13/2012, 3:25 PM

## 2012-04-13 NOTE — Progress Notes (Signed)
[facility] INPATIENT:  Family/Significant Other Suicide Prevention Education  Suicide Prevention Education:  Family/Significant Other Refusal to Support Patient after Discharge:  Suicide Prevention Education Not Provided:  Patient has identified home of family/significant other as the place the patient will be residing after discharge.  With written consent contact made with Ms. Clinton to provide Suicide Prevention Education to [employer], Mother, [phone].  This person indicates he/she will not be responsible for the patient after discharge.  Ms. Joni Fears stated she did not want to receive suicide prevention education.  Mother is considering allowing patient to return to her home after completing treatment at [facility].  Wynn Banker 04/13/2012,8:32 AM

## 2012-04-13 NOTE — Progress Notes (Signed)
[facility] LCSW Aftercare Discharge Planning Group Note  04/13/2012 1:00 PM  Participation Quality:  Patient did not attend.  Matthew Mora 04/13/2012, 1:00 PM

## 2012-04-13 NOTE — Progress Notes (Signed)
[facility] MD Progress Note  04/13/2012 3:20 PM Matthew Mora  MRN:  [id number] Subjective:  10/10 depression and anxiety with suicidal ideations Diagnosis:   Axis I: Anxiety Disorder NOS, Depressive Disorder NOS, Substance Abuse and Substance Induced Mood Disorder Axis II: Deferred Axis III:  Past Medical History  Diagnosis Date  . Bipolar 1 disorder    Axis IV: economic problems, housing problems, occupational problems, other psychosocial or environmental problems, problems related to social environment and problems with primary support group Axis V: 41-50 serious symptoms  ADL's:  Intact  Sleep: Poor  Appetite:  Poor  Suicidal Ideation:  Plan:  None Intent:  None Means:  None Homicidal Ideation:  Denies  Psychiatric Specialty Exam: Review of Systems  Constitutional: Negative.   HENT: Negative.   Eyes: Negative.   Respiratory: Negative.   Cardiovascular: Negative.   Gastrointestinal: Positive for nausea.  Genitourinary: Negative.   Musculoskeletal: Positive for myalgias.  Skin: Negative.   Neurological: Negative.   Endo/Heme/Allergies: Negative.   Psychiatric/Behavioral: Positive for depression and suicidal ideas. The patient is nervous/anxious.     Blood pressure 105/71, pulse 93, temperature 98.7 F (37.1 C), temperature source Oral, resp. rate 16, height 5\' 10"  (1.778 m), weight 84.823 kg (187 lb), SpO2 98.00%.Body mass index is 26.83 kg/(m^2).  General Appearance: Casual  Eye Contact::  Fair  Speech:  Normal Rate  Volume:  Normal  Mood:  Anxious and Depressed  Affect:  Flat  Thought Process:  Coherent  Orientation:  Full (Time, Place, and Person)  Thought Content:  WDL  Suicidal Thoughts:  Yes.  without intent/plan  Homicidal Thoughts:  No  Memory:  Immediate;   Fair Recent;   Fair Remote;   Fair  Judgement:  Fair  Insight:  Lacking  Psychomotor Activity:  Decreased  Concentration:  Fair  Recall:  Fair  Akathisia:  No  Handed:  Right  AIMS (if  indicated):     Assets:  Physical Health Resilience Social Support  Sleep:  Number of Hours: 5.25    Current Medications: Current Facility-Administered Medications  Medication Dose Route Frequency Provider Last Rate Last Dose  . acetaminophen (TYLENOL) tablet 650 mg  650 mg Oral Q6H PRN Nanine Means, NP      . alum & mag hydroxide-simeth (MAALOX/MYLANTA) 200-200-20 MG/5ML suspension 30 mL  30 mL Oral Q4H PRN Nanine Means, NP      . divalproex (DEPAKOTE) DR tablet 500 mg  500 mg Oral Q8H Himabindu Ravi, MD   500 mg at 04/13/12 4098  . docusate sodium (COLACE) capsule 100 mg  100 mg Oral BID Kerry Hough, PA   100 mg at 04/13/12 0745  . LORazepam (ATIVAN) tablet 1 mg  1 mg Oral Q6H PRN Nanine Means, NP   1 mg at 04/07/12 1621  . magnesium hydroxide (MILK OF MAGNESIA) suspension 30 mL  30 mL Oral Daily PRN Nanine Means, NP      . OLANZapine (ZYPREXA) tablet 10 mg  10 mg Oral Daily Himabindu Ravi, MD   10 mg at 04/13/12 0745  . OLANZapine (ZYPREXA) tablet 20 mg  20 mg Oral QHS Nanine Means, NP   20 mg at 04/12/12 2136  . pantoprazole (PROTONIX) EC tablet 40 mg  40 mg Oral Daily Kerry Hough, PA   40 mg at 04/13/12 0746  . traZODone (DESYREL) tablet 100 mg  100 mg Oral QHS PRN Fredrik Cove, PA-C   100 mg at 04/12/12 2136    Lab Results: No  results found for this or any previous visit (from the past 48 hour(s)).  Physical Findings: AIMS: Facial and Oral Movements Muscles of Facial Expression: None, normal Lips and Perioral Area: None, normal Jaw: None, normal Tongue: None, normal,Extremity Movements Upper (arms, wrists, hands, fingers): None, normal Lower (legs, knees, ankles, toes): None, normal, Trunk Movements Neck, shoulders, hips: None, normal, Overall Severity Severity of abnormal movements (highest score from questions above): None, normal Incapacitation due to abnormal movements: None, normal Patient's awareness of abnormal movements (rate only patient's report): No  Awareness, Dental Status Current problems with teeth and/or dentures?: No Does patient usually wear dentures?: No  CIWA:    COWS:     Treatment Plan Summary: Daily contact with patient to assess and evaluate symptoms and progress in treatment Medication management  Plan:  Review of chart, vital signs, medications, and notes. 1-Estimated length of stay 1-3 days past his current stay of 10 2-Individual and group therapy encouraged 3-Medication management for depression and anxiety to reduce current symptoms to base line and improve the patient's overall level of functioning:  Medications reviewed with the patient and he stated no untoward effects.  He stated he is nauseous with no appetite--encouraged him to take some zofran but he refused, offered to order some phenergan since he has taken zofran this morning and said it was not effective--he refused but will replace it and keep encouraging him 4-Coping skills for depression and anxiety developing-- 5-Continue crisis stabilization and management 6-Address health issues--monitoring blood pressures, stable 7-Treatment plan in progress to prevent relapse of depression and anxiety 8-Psychosocial education regarding relapse prevention and self-care    Medical Decision Making Problem Points:  Established problem, stable/improving (1) and Review of psycho-social stressors (1) Data Points:  Review of medication regiment & side effects (2)  I certify that inpatient services furnished can reasonably be expected to improve the patient's condition.   Nanine Means, PMH-NP 04/13/2012, 3:20 PM

## 2012-04-13 NOTE — Tx Team (Signed)
Interdisciplinary Treatment Plan Update (Adult)  Date:  04/13/2012  Time Reviewed:  10:03 AM   Progress in Treatment: Attending groups:   Yes   Participating in groups:  Yes Taking medication as prescribed:  Yes Tolerating medication:  Yes Family/Significant othe contact made: Contact made with family Patient understands diagnosis:  Yes Discussing patient identified problems/goals with staff: Yes Medical problems stabilized or resolved: Yes Denies suicidal/homicidal ideation:Yes Issues/concerns per patient self-inventory:  Other:   New problem(s) identified:  Reason for Continuation of Hospitalization: Anxiety Depression Medication stabilization Suicidal ideation  Interventions implemented related to continuation of hospitalization:  Medication Management; safety checks q 15 mins  Additional comments:  Estimated length of stay:  2-3 days  Discharge Plan:  Home with outpatient follow up  New goal(s):  Review of initial/current patient goals per problem list:    1.  Goal(s): Eliminate SI/other thoughts of self harm   Met:  Yes  Target date: d/c  As evidenced by: Patient will no longer endorse SI/HI or other thoughts of self harm.    2.  Goal (s):Reduce depression/anxiety (Patient will rate symptoms at four or below)  Met: Yes  Target date: d/c  As evidenced by: Patient rates symptoms at three/four     3.  Goal(s):.stabilize on meds (Patient will report be stabilized medication)   Met:  Yes  Target date: d/c  As evidenced by: Patient will report being stabilized on medications - less symptomatic    4.  Goal(s): Refer for outpatient follow up (Follow up appointment will be scheduled)   Met:  Yes  Target date: d/c  As evidenced by: Follow up appointment scheduled    Attendees: Patient:   04/13/2012 10:03 AM  Physican:  Patrick North, MD 04/13/2012 10:03 AM  Nursing:  Nestor Ramp, RN 04/13/2012 10:03 AM   Nursing:   Berneice Heinrich, RN 04/13/2012  10:03 AM   Clinical Social Worker:  Juline Patch, LCSW 04/13/2012 10:03 AM   Other: Tera Helper, PHM-NP 04/13/2012 10:03 AM   Other:   04/13/2012 10:03 AM Other:        04/13/2012 10:03 AM

## 2012-04-14 NOTE — Progress Notes (Signed)
Patient has been up and active on the unit, and has voiced no complaints. Patient currently denies having pain, -si/hi/a/v hall.  Patient reports that he will be going to [facility] once discharged and is looking forward to going there.Support and encouragement offered, safety maintained on unit, will continue to monitor.

## 2012-04-14 NOTE — Progress Notes (Signed)
Patient ID: Matthew Mora, male   DOB: Jun 25, 1970, 42 y.o.   MRN: [id number] [facility] MD Progress Note  04/14/2012 10:55 AM Andrue Dini  MRN:  [id number] Subjective: Left group demanded to be seen and then requested discharge. Left group because he didn't  Like the topic being discussed.  Plan is to transfer to [facility] residential Monday . Patient was told discharge would not be beneficial at this time.  Axis I: Anxiety Disorder NOS, Depressive Disorder NOS, Substance Abuse and Substance Induced Mood Disorder Axis II: Deferred Axis III:  Past Medical History  Diagnosis Date  . Bipolar 1 disorder    Axis IV: economic problems, housing problems, occupational problems, other psychosocial or environmental problems, problems related to social environment and problems with primary support group Axis V: 41-50 serious symptoms  ADL's:  Intact  Sleep: Poor  Appetite:  Poor  Suicidal Ideation:  Plan:  None Intent:  None Means:  None Homicidal Ideation:  Denies  Psychiatric Specialty Exam: Review of Systems  Constitutional: Negative.   HENT: Negative.   Eyes: Negative.   Respiratory: Negative.   Cardiovascular: Negative.   Gastrointestinal: Positive for nausea.  Genitourinary: Negative.   Musculoskeletal: Positive for myalgias.  Skin: Negative.   Neurological: Negative.   Endo/Heme/Allergies: Negative.   Psychiatric/Behavioral: Positive for depression and suicidal ideas. The patient is nervous/anxious.     Blood pressure 120/74, pulse 82, temperature 97.6 F (36.4 C), temperature source Oral, resp. rate 18, height 5\' 10"  (1.778 m), weight 84.823 kg (187 lb), SpO2 98.00%.Body mass index is 26.83 kg/(m^2).  General Appearance: Casual  Eye Contact::  Fair  Speech:  Normal Rate  Volume:  Normal  Mood:  Anxious and Depressed  Affect:  Flat  Thought Process:  Coherent  Orientation:  Full (Time, Place, and Person)  Thought Content:  WDL  Suicidal Thoughts:  Yes.  without  intent/plan  Homicidal Thoughts:  No  Memory:  Immediate;   Fair Recent;   Fair Remote;   Fair  Judgement:  Fair  Insight:  Lacking  Psychomotor Activity:  Decreased  Concentration:  Fair  Recall:  Fair  Akathisia:  No  Handed:  Right  AIMS (if indicated):     Assets:  Physical Health Resilience Social Support  Sleep:  Number of Hours: 6.25    Current Medications: Current Facility-Administered Medications  Medication Dose Route Frequency Provider Last Rate Last Dose  . acetaminophen (TYLENOL) tablet 650 mg  650 mg Oral Q6H PRN Nanine Means, NP      . alum & mag hydroxide-simeth (MAALOX/MYLANTA) 200-200-20 MG/5ML suspension 30 mL  30 mL Oral Q4H PRN Nanine Means, NP      . divalproex (DEPAKOTE) DR tablet 500 mg  500 mg Oral Q8H Himabindu Ravi, MD   500 mg at 04/14/12 0653  . docusate sodium (COLACE) capsule 100 mg  100 mg Oral BID Kerry Hough, PA   100 mg at 04/14/12 0854  . LORazepam (ATIVAN) tablet 1 mg  1 mg Oral Q6H PRN Nanine Means, NP   1 mg at 04/07/12 1621  . magnesium hydroxide (MILK OF MAGNESIA) suspension 30 mL  30 mL Oral Daily PRN Nanine Means, NP      . OLANZapine (ZYPREXA) tablet 10 mg  10 mg Oral Daily Himabindu Ravi, MD   10 mg at 04/14/12 0854  . OLANZapine (ZYPREXA) tablet 20 mg  20 mg Oral QHS Nanine Means, NP   20 mg at 04/13/12 2208  . pantoprazole (PROTONIX) EC tablet 40  mg  40 mg Oral Daily Kerry Hough, PA   40 mg at 04/14/12 0854  . promethazine (PHENERGAN) tablet 25 mg  25 mg Oral Q6H PRN Nanine Means, NP      . traZODone (DESYREL) tablet 100 mg  100 mg Oral QHS PRN Fredrik Cove, PA-C   100 mg at 04/13/12 2208    Lab Results: No results found for this or any previous visit (from the past 48 hour(s)).  Physical Findings: AIMS: Facial and Oral Movements Muscles of Facial Expression: None, normal Lips and Perioral Area: None, normal Jaw: None, normal Tongue: None, normal,Extremity Movements Upper (arms, wrists, hands, fingers): None,  normal Lower (legs, knees, ankles, toes): None, normal, Trunk Movements Neck, shoulders, hips: None, normal, Overall Severity Severity of abnormal movements (highest score from questions above): None, normal Incapacitation due to abnormal movements: None, normal Patient's awareness of abnormal movements (rate only patient's report): No Awareness, Dental Status Current problems with teeth and/or dentures?: No Does patient usually wear dentures?: No  CIWA:    COWS:     Treatment Plan Summary: Daily contact with patient to assess and evaluate symptoms and progress in treatment Medication management  Plan:  Review of chart, vital signs, medications, and notes. 1-Estimated length of stay 1-3 days past his current stay of 11 2-Individual and group therapy encouraged 3-Medication management for depression and anxiety to reduce current symptoms to base line and improve the patient's overall level of functioning:  Medications reviewed with the patient and he stated no untoward effects.  He stated he is nauseous with no appetite--encouraged him to take some zofran but he refused, offered to order some phenergan since he has taken zofran this morning and said it was not effective--he refused but will replace it and keep encouraging him 4-Coping skills for depression and anxiety developing-- 5-Continue crisis stabilization and management 6-Address health issues--monitoring blood pressures, stable 7-Treatment plan in progress to prevent relapse of depression and anxiety 8-Psychosocial education regarding relapse prevention and self-care    Medical Decision Making Problem Points:  Established problem, stable/improving (1) and Review of psycho-social stressors (1) Data Points:  Review of medication regiment & side effects (2)  Reviewed note and discussed with the provider and agree with the above plan. I certify that inpatient services furnished can reasonably be expected to improve the patient's  condition.   ADAMS,MICKIE D. RPA-C CAQ-Psych  04/14/2012, 10:55 AM

## 2012-04-14 NOTE — Progress Notes (Signed)
D: Pt in bed resting with eyes closed. Respirations even and unlabored. Pt appears to be in no signs of distress at this time. A: Q15min checks remains for this pt. R: Pt remains safe at this time.   

## 2012-04-14 NOTE — Progress Notes (Signed)
Patient ID: Matthew Mora, male   DOB: [birth year], 42 y.o.   MRN: [id number] D: Pt is awake and active on the unit this AM. Pt denies SI/HI and A/V hallucinations. Pt is participating in the milieu and is cooperative with staff, although he lacks insight and blames others for his circumstances. Pt rates their depression at 4 and hopelessness at 4. Pt mood is apathetic and his affect is blunted. Pt is irritable and is resistant to staff input.   A: Writer utilized therapeutic communication, encouraged pt to discuss feelings with staff and administered medication per MD orders. Writer also encouraged pt to attend groups. Pt states that he feels helpless and that he has no options in life. Pt minimizes his ETOH/drug abuse and fails to take responsibility for his decisions. Pt blames others/mother for his present circumstances but does not see how he can help himself.   R: Pt is attending groups but leaves in the middle. Pt is tolerating medications well. Writer will continue to monitor. 15 minute checks are ongoing for safety.

## 2012-04-14 NOTE — Clinical Social Work Psychosocial (Signed)
[facility] Group Notes: (Clinical Social Work)   04/14/2012      Type of Therapy:  Group Therapy   Participation Level:  Did Not Attend    Ambrose Mantle, LCSW 04/14/2012, 4:36 PM

## 2012-04-14 NOTE — Progress Notes (Signed)
[facility] Group Notes:  (Nursing/MHT/Case Management/Adjunct)  Date:  04/14/2012  Time:  2:26 AM  Type of Therapy:  Psychoeducational Skills  Participation Level:  Active  Participation Quality:  Attentive  Affect:  Appropriate  Cognitive:  Appropriate  Insight:  Good  Engagement in Group:  Improving  Modes of Intervention:  Education  Summary of Progress/Problems: The patient mentioned in group today that he had a good day. For one, he stated that he attended more groups today. Secondly, he stated that his medications were working well for him. Finally, he mentioned that he had a good conversation with his mother. His goal for tomorrow is to call his mother and keep her apprised of his discharge plans/instructions.   Matthew Mora S 04/14/2012, 2:26 AM

## 2012-04-14 NOTE — Progress Notes (Signed)
Adult Psychoeducational Group Note  Date:  04/14/2012 Time:  0945  Group Topic/Focus:  Coping With Mental Health Crisis:   The purpose of this group is to help patients identify strategies for coping with mental health crisis.  Group discusses possible causes of crisis and ways to manage them effectively. Self inventory review  Participation Level:  Active  Participation Quality:  Attentive, Intrusive, Monopolizing, Redirectable and Resistant  Affect:  Anxious, Defensive, Irritable and Resistant  Cognitive:  Alert  Insight: Lacking  Engagement in Group:  Defensive, Distracting, Monopolizing, Off Topic and Resistant  Modes of Intervention:  Clarification, Discussion, Education, Limit-setting, Problem-solving and Support  Additional Comments:  Pt consistently blames others, especially his mother, for his present circumstances. Pt is resistant and became angry when staff set limits. Pt walked out of group and skipped further groups afterwards.   Barbette Merino, Soren Lazarz Shari Prows 04/14/2012, 3:43 PM

## 2012-04-15 MED ORDER — OLANZAPINE 10 MG PO TABS: ORAL_TABLET | ORAL | Status: DC

## 2012-04-15 MED ORDER — TRAZODONE HCL 100 MG PO TABS: 100.0000 mg | ORAL_TABLET | Freq: Every evening | ORAL | Status: DC | PRN

## 2012-04-15 MED ORDER — DIVALPROEX SODIUM 500 MG PO DR TAB: 500.0000 mg | DELAYED_RELEASE_TABLET | Freq: Three times a day (TID) | ORAL | Status: DC

## 2012-04-15 NOTE — Progress Notes (Signed)
Patient has been up and active on the unit, attended group this evening and complained that the first group did not go well for him and it increased his anxiety and he reported that he left before he said anything wrong to the nurse conducting the group and he reported that he didn't attend any more groups for the day. Patient currently denies having pain, -si/hi/a/v hall. Support and encouragement offered, safety maintained on unit, will continue to monitor. Patient reports ready for discharge on Monday.

## 2012-04-15 NOTE — Progress Notes (Signed)
Adult Psychoeducational Group Note  Date:  04/15/2012 Time:  1015  Group Topic/Focus:  Healthy Support Networks  Participation Level:  Did Not Attend. Pt refuses to get out of bed and attend groups   Emeric Novinger Shari Prows 04/15/2012, 11:25 AM

## 2012-04-15 NOTE — Progress Notes (Signed)
[facility] Group Notes:  (Nursing/MHT/Case Management/Adjunct)  Date:  04/15/2012  Time:  2:41 AM  Type of Therapy:  Psychoeducational Skills  Participation Level:  Minimal  Participation Quality:  Inattentive  Affect:  Depressed  Cognitive:  Appropriate  Insight:  Improving  Engagement in Group:  Lacking  Modes of Intervention:  Education  Summary of Progress/Problems: The patient verbalized in group this evening that he experienced quite a bit of anxiety in the morning, but that the symptoms were alleviated later in the day. He anticipates being discharged from the hospital on Monday. He mentioned briefly that he is ready to start facing his responsibilities.   Sandrine Bloodsworth S 04/15/2012, 2:41 AM

## 2012-04-15 NOTE — Clinical Social Work Psychosocial (Signed)
[facility] Group Notes: (Clinical Social Work)   04/15/2012      Type of Therapy:  Group Therapy   Participation Level:  Did Not Attend    Ambrose Mantle, LCSW 04/15/2012, 4:39 PM

## 2012-04-15 NOTE — Progress Notes (Signed)
Patient ID: Matthew Mora, male   DOB: 11-12-1970, 42 y.o.   MRN: [id number] [facility] MD Progress Note  04/15/2012 9:30 AM Donal Lynam  MRN:  [id number] Subjective: Seen in room in bed. Very full from breakfast. Says he is ready to go to [facility] in am. Mood is great slept well denies SI/HI/AVH.    Axis I: Anxiety Disorder NOS, Depressive Disorder NOS, Substance Abuse and Substance Induced Mood Disorder Axis II: Deferred Axis III:  Past Medical History  Diagnosis Date  . Bipolar 1 disorder    Axis IV: economic problems, housing problems, occupational problems, other psychosocial or environmental problems, problems related to social environment and problems with primary support group Axis V: 41-50 serious symptoms  ADL's:  Intact  Sleep: Poor  Appetite:  Poor  Suicidal Ideation:  Plan:  None Intent:  None Means:  None Homicidal Ideation:  Denies  Psychiatric Specialty Exam: Review of Systems  Constitutional: Negative.   HENT: Negative.   Eyes: Negative.   Respiratory: Negative.   Cardiovascular: Negative.   Gastrointestinal: Positive for nausea.  Genitourinary: Negative.   Musculoskeletal: Positive for myalgias.  Skin: Negative.   Neurological: Negative.   Endo/Heme/Allergies: Negative.   Psychiatric/Behavioral: Positive for depression and suicidal ideas. The patient is nervous/anxious.     Blood pressure 117/81, pulse 93, temperature 98.1 F (36.7 C), temperature source Oral, resp. rate 16, height 5\' 10"  (1.778 m), weight 84.823 kg (187 lb), SpO2 98.00%.Body mass index is 26.83 kg/(m^2).  General Appearance: Casual  Eye Contact::  Fair  Speech:  Normal Rate  Volume:  Normal  Mood:  Anxious and Depressed  Affect:  Flat  Thought Process:  Coherent  Orientation:  Full (Time, Place, and Person)  Thought Content:  WDL  Suicidal Thoughts:  Yes.  without intent/plan  Homicidal Thoughts:  No  Memory:  Immediate;   Fair Recent;   Fair Remote;   Fair  Judgement:  Fair   Insight:  Lacking  Psychomotor Activity:  Decreased  Concentration:  Fair  Recall:  Fair  Akathisia:  No  Handed:  Right  AIMS (if indicated):     Assets:  Physical Health Resilience Social Support  Sleep:  Number of Hours: 6    Current Medications: Current Facility-Administered Medications  Medication Dose Route Frequency Provider Last Rate Last Dose  . acetaminophen (TYLENOL) tablet 650 mg  650 mg Oral Q6H PRN Nanine Means, NP      . alum & mag hydroxide-simeth (MAALOX/MYLANTA) 200-200-20 MG/5ML suspension 30 mL  30 mL Oral Q4H PRN Nanine Means, NP      . divalproex (DEPAKOTE) DR tablet 500 mg  500 mg Oral Q8H Himabindu Ravi, MD   500 mg at 04/15/12 0607  . docusate sodium (COLACE) capsule 100 mg  100 mg Oral BID Kerry Hough, PA   100 mg at 04/15/12 0815  . LORazepam (ATIVAN) tablet 1 mg  1 mg Oral Q6H PRN Nanine Means, NP   1 mg at 04/07/12 1621  . magnesium hydroxide (MILK OF MAGNESIA) suspension 30 mL  30 mL Oral Daily PRN Nanine Means, NP      . OLANZapine (ZYPREXA) tablet 10 mg  10 mg Oral Daily Himabindu Ravi, MD   10 mg at 04/15/12 0815  . OLANZapine (ZYPREXA) tablet 20 mg  20 mg Oral QHS Nanine Means, NP   20 mg at 04/14/12 2307  . pantoprazole (PROTONIX) EC tablet 40 mg  40 mg Oral Daily Kerry Hough, PA   40 mg  at 04/15/12 0815  . promethazine (PHENERGAN) tablet 25 mg  25 mg Oral Q6H PRN Nanine Means, NP      . traZODone (DESYREL) tablet 100 mg  100 mg Oral QHS PRN Fredrik Cove, PA-C   100 mg at 04/14/12 2307    Lab Results: No results found for this or any previous visit (from the past 48 hour(s)).  Physical Findings: AIMS: Facial and Oral Movements Muscles of Facial Expression: None, normal Lips and Perioral Area: None, normal Jaw: None, normal Tongue: None, normal,Extremity Movements Upper (arms, wrists, hands, fingers): None, normal Lower (legs, knees, ankles, toes): None, normal, Trunk Movements Neck, shoulders, hips: None, normal, Overall  Severity Severity of abnormal movements (highest score from questions above): None, normal Incapacitation due to abnormal movements: None, normal Patient's awareness of abnormal movements (rate only patient's report): No Awareness, Dental Status Current problems with teeth and/or dentures?: No Does patient usually wear dentures?: No  CIWA:    COWS:     Treatment Plan Summary: Daily contact with patient to assess and evaluate symptoms and progress in treatment Medication management  Plan:  Review of chart, vital signs, medications, and notes. 1-Estimated length of stay 1-3 days past his current stay of 12 2-Individual and group therapy encouraged 3-Medication management for depression and anxiety to reduce current symptoms to base line and improve the patient's overall level of functioning:  Medications reviewed with the patient and he stated no untoward effects.  He stated he is nauseous with no appetite--encouraged him to take some zofran but he refused, offered to order some phenergan since he has taken zofran this morning and said it was not effective--he refused but will replace it and keep encouraging him 4-Coping skills for depression and anxiety developing-- 5-Continue crisis stabilization and management 6-Address health issues--monitoring blood pressures, stable 7-Treatment plan in progress to prevent relapse of depression and anxiety 8-Psychosocial education regarding relapse prevention and self-care    Medical Decision Making Problem Points:  Established problem, stable/improving (1) and Review of psycho-social stressors (1) Data Points:  Review of medication regiment & side effects (2)  Reviewed note and discussed with the provider and agree with the above plan. I certify that inpatient services furnished can reasonably be expected to improve the patient's condition.    Will be transferred to [facility] in the am.  ADAMS,MICKIE D. RPA-C CAQ-Psych  04/15/2012, 9:30 AM

## 2012-04-15 NOTE — [facility] Suicide Risk Assessment (Signed)
Suicide Risk Assessment  Discharge Assessment     Demographic Factors:  Male, Age 40 or older, Age 79 or older, Adolescent or young adult, Divorced or widowed, Caucasian, [name], lesbian, or bisexual orientation, Low socioeconomic status, Living alone, Unemployed and Access to firearms  Mental Status Per Nursing Assessment::   On Admission:  Suicidal ideation indicated by others  Current Mental Status by Physician: Patient denies current suicidal ideation, intent, or, plans.  Loss Factors: Loss of significant relationship and Financial problems/change in socioeconomic status  Historical Factors: Prior suicide attempts  Risk Reduction Factors:   Positive social support, Positive therapeutic relationship and Discharge to substance abuse facility.  Continued Clinical Symptoms:  Bipolar Disorder:   Mixed State  Cognitive Features That Contribute To Risk:  Polarized thinking    Suicide Risk:  Mild:  Suicidal ideation of limited frequency, intensity, duration, and specificity.  There are no identifiable plans, no associated intent, mild dysphoria and related symptoms, good self-control (both objective and subjective assessment), few other risk factors, and identifiable protective factors, including available and accessible social support.  Discharge Diagnoses:   AXIS I:  Bipolar, mixed AXIS II:  No diagnosis AXIS III:   Past Medical History  Diagnosis Date  . Bipolar 1 disorder    AXIS IV:  economic problems and housing problems AXIS V:  51-60 moderate symptoms  Plan Of Care/Follow-up recommendations:  Activity:  Increase as tolerated. Diet:  Regular. Tests:  None Other:  Discharge to [facility] program.  Is patient on multiple antipsychotic therapies at discharge:  No   Has Patient had three or more failed trials of antipsychotic monotherapy by history:  No  Recommended Plan for Multiple Antipsychotic Therapies:No Applicable.   Matthew Mora 04/15/2012,  4:18 PM

## 2012-04-15 NOTE — Progress Notes (Signed)
Adult Psychoeducational Group Note  Date:  04/15/2012 Time:  0900  Group Topic/Focus:  Self Inventory Review  Participation Level:  Did Not Attend. Pt refused to get out of bed and attend group.     Reyne Falconi Shari Prows 04/15/2012, 9:50 AM

## 2012-04-15 NOTE — Progress Notes (Signed)
Patient has been up and active on the unit, and has voiced no complaints. Patient currently denies having pain, -si/hi/a/v hall. Support and encouragement offered, safety maintained on unit, will continue to monitor. Patient looking forward to discharge in the am.

## 2012-04-16 NOTE — Discharge Summary (Signed)
Physician Discharge Summary Note  Patient:  Matthew Mora is an 42 y.o., male MRN:  [id number] DOB:  1970-04-27 Patient phone:  [phone] (home)  Patient address:   [address] [place] [address],   Date of Admission:  04/03/2012 Date of Discharge: 04/16/2012  Reason for Admission:  Suicidal and homicidal ideations, substance abuse  Discharge Diagnoses: Active Problems:  Mood disorder  Depression  Psychiatric symptoms  Review of Systems  Constitutional: Negative.   HENT: Negative.   Eyes: Negative.   Respiratory: Negative.   Cardiovascular: Negative.   Gastrointestinal: Negative.   Genitourinary: Negative.   Musculoskeletal: Negative.   Skin: Negative.   Neurological: Negative.   Endo/Heme/Allergies: Negative.   Psychiatric/Behavioral: Positive for substance abuse.   Axis Diagnosis:   AXIS I:  Anxiety Disorder NOS, Bipolar, Depressed, Substance Abuse and Substance Induced Mood Disorder AXIS II:  Deferred AXIS III:   Past Medical History  Diagnosis Date  . Bipolar 1 disorder    AXIS IV:  housing problems, occupational problems, other psychosocial or environmental problems, problems related to social environment and problems with primary support group AXIS V:  61-70 mild symptoms  Level of Care:  [facility] Course:  Reviewed chart, vital signs, medications, and notes. 1-Individual and group therapy completed 2-Medication management for mood stability:  Medications reviewed with the patient and no side effects voiced 3-Coping skills for depression, anxiety, and anger management developed and utilized 4-Crisis stabilization and management completed 5-Addressed health issues--stable 6-Treatment plan in progress to prevent relapse of depression and anxiety 7-Patient denied suicidal/homicidal ideations and auditory/visual hallucinations, follow-up appointments encouraged to attend, Rx and 14 day supply of medications given at discharge, patient will  continue his care and sobriety at [facility].   Consults:  None  Significant Diagnostic Studies:  labs: Completed and reviewed, stable  Discharge Vitals:   Blood pressure 117/81, pulse 93, temperature 98.1 F (36.7 C), temperature source Oral, resp. rate 16, height 5\' 10"  (1.778 m), weight 84.823 kg (187 lb), SpO2 98.00%. Body mass index is 26.83 kg/(m^2). Lab Results:   No results found for this or any previous visit (from the past 72 hour(s)).  Physical Findings: AIMS: Facial and Oral Movements Muscles of Facial Expression: None, normal Lips and Perioral Area: None, normal Jaw: None, normal Tongue: None, normal,Extremity Movements Upper (arms, wrists, hands, fingers): None, normal Lower (legs, knees, ankles, toes): None, normal, Trunk Movements Neck, shoulders, hips: None, normal, Overall Severity Severity of abnormal movements (highest score from questions above): None, normal Incapacitation due to abnormal movements: None, normal Patient's awareness of abnormal movements (rate only patient's report): No Awareness, Dental Status Current problems with teeth and/or dentures?: No Does patient usually wear dentures?: No  CIWA:    COWS:     Psychiatric Specialty Exam: See Psychiatric Specialty Exam and Suicide Risk Assessment completed by Attending Physician prior to discharge.  Discharge destination:  [facility]  Is patient on multiple antipsychotic therapies at discharge:  No   Has Patient had three or more failed trials of antipsychotic monotherapy by history:  No Recommended Plan for Multiple Antipsychotic Therapies:  N/A  Discharge Orders    Future Orders Please Complete By Expires   Diet - low sodium heart healthy      Increase activity slowly          Medication List     As of 04/16/2012  8:49 AM    STOP taking these medications         divalproex 500 MG  24 hr tablet   Commonly known as: DEPAKOTE ER      TAKE these medications      Indication     divalproex 500 MG DR tablet   Commonly known as: DEPAKOTE   Take 1 tablet (500 mg total) by mouth every 8 (eight) hours.    Indication: Manic Phase of Manic-Depression      OLANZapine 10 MG tablet   Commonly known as: ZYPREXA   Take one 10 mg tab am and 2 in pm for psychosis       traZODone 100 MG tablet   Commonly known as: DESYREL   Take 1 tablet (100 mg total) by mouth at bedtime as needed for sleep.    Indication: Trouble Sleeping           Follow-up Information    Follow up with [facility]. On 04/16/2012. (You are scheduled for an admission assessment at [facility] on Monday, April 16, 2012 at [place])    Contact information:   [address] [place], [place]   [address]  [phone]         Follow-up recommendations:  Activity as tolerated, low-sodium heart healthy diet  Comments:  Patient discharged to [facility] this am.  Total Discharge Time:  Greater than 30 minutes  Signed: Nanine Means, PMH-NP 04/16/2012, 8:49 AM

## 2012-04-16 NOTE — Discharge Summary (Signed)
Reviewed

## 2012-04-16 NOTE — Progress Notes (Signed)
Reviewed

## 2012-04-16 NOTE — Progress Notes (Addendum)
Discharge note - Writer explained appointment location and time, discharge instructions, times and dosage of medications to be taken once home. Patients belongings were returned to him from locker # 8. Patient had no questions and was discharged to lobby to catch bus to [facility]. Patient denies si/hi/a/v hallucinations, support and encouragement offered.Dischaerge

## 2012-04-17 NOTE — Progress Notes (Signed)
Patient Discharge Instructions:  After Visit Summary (AVS):   Faxed to:  04/17/12 Discharge Summary Note:   Faxed to:  04/17/12 Psychiatric Admission Assessment Note:   Faxed to:  04/17/12 Suicide Risk Assessment - Discharge Assessment:   Faxed to:  04/17/12 Faxed/Sent to the Next Level Care provider:  04/17/12 Faxed to [facility] @ [phone]  Jerelene Redden, 04/17/2012, 3:31 PM

## 2013-12-07 ENCOUNTER — Encounter (HOSPITAL_COMMUNITY): Payer: Self-pay | Admitting: Emergency Medicine

## 2013-12-07 ENCOUNTER — Emergency Department (HOSPITAL_COMMUNITY)
Admission: EM | Admit: 2013-12-07 | Discharge: 2013-12-07 | Disposition: A | Discharge status: Home / Self Care | Payer: Self-pay | Attending: Emergency Medicine | Admitting: Emergency Medicine

## 2013-12-07 DIAGNOSIS — F319 Bipolar disorder, unspecified: Secondary | ICD-10-CM | POA: Insufficient documentation

## 2013-12-07 DIAGNOSIS — IMO0002 Reserved for concepts with insufficient information to code with codable children: Secondary | ICD-10-CM | POA: Insufficient documentation

## 2013-12-07 DIAGNOSIS — Z760 Encounter for issue of repeat prescription: Secondary | ICD-10-CM | POA: Insufficient documentation

## 2013-12-07 DIAGNOSIS — Z79899 Other long term (current) drug therapy: Secondary | ICD-10-CM | POA: Insufficient documentation

## 2013-12-07 MED ORDER — DIVALPROEX SODIUM 500 MG PO DR TAB: 500.0000 mg | DELAYED_RELEASE_TABLET | Freq: Three times a day (TID) | ORAL | Status: DC

## 2013-12-07 MED ORDER — OLANZAPINE 10 MG PO TABS: ORAL_TABLET | ORAL | Status: DC

## 2013-12-07 MED ORDER — TRAZODONE HCL 100 MG PO TABS: 100.0000 mg | ORAL_TABLET | Freq: Every evening | ORAL | Status: DC | PRN

## 2013-12-07 NOTE — ED Provider Notes (Signed)
CSN: [id number]     Arrival date & time 12/07/13  2218 History   First MD Initiated Contact with Patient 12/07/13 2337     Chief Complaint  Patient presents with  . Manic Behavior  . Medication Refill     (Consider location/radiation/quality/duration/timing/severity/associated sxs/prior Treatment) HPI Comments: Patient here for a refill of his prescriptions after being at [facility] where they told him he would have to pay for his medication   Has been off his medications for 4 months   The history is provided by the patient.    Past Medical History  Diagnosis Date  . Bipolar 1 disorder    History reviewed. No pertinent past surgical history. History reviewed. No pertinent family history. History  Substance Use Topics  . Smoking status: Never Smoker   . Smokeless tobacco: Not on file  . Alcohol Use: No     Comment: Refuses to answer but presented to the ED with alcohol in his system.    Review of Systems  Constitutional:       Patient arrives very agitiated verbally abusive and threatening   Psychiatric/Behavioral: Positive for agitation. Negative for suicidal ideas.  All other systems reviewed and are negative.     Allergies  Review of patient's allergies indicates no known allergies.  Home Medications   Prior to Admission medications   Medication Sig Start Date End Date Taking? Authorizing Provider  divalproex (DEPAKOTE) 500 MG DR tablet Take 1 tablet (500 mg total) by mouth every 8 (eight) hours. 12/07/13   Arman Filter, NP  OLANZapine (ZYPREXA) 10 MG tablet Take one 10 mg tab am and 2 in pm for psychosis 12/07/13   Arman Filter, NP  traZODone (DESYREL) 100 MG tablet Take 1 tablet (100 mg total) by mouth at bedtime as needed for sleep. 12/07/13   Arman Filter, NP   BP 103/62  Pulse 102  Temp(Src) 99.1 F (37.3 C) (Oral)  Resp 19  Ht  (1.854 m)  Wt 190 lb (86.183 kg)  BMI 25.07 kg/m2  SpO2 97% Physical Exam  Nursing note and vitals  reviewed. Constitutional: He appears well-developed and well-nourished.  HENT:  Head: Normocephalic.  Cardiovascular: Normal rate.   Pulmonary/Chest: Effort normal.  Neurological: He is alert.  Psychiatric: His affect is angry. His speech is rapid and/or pressured. He is agitated and aggressive. He expresses inappropriate judgment. He expresses no homicidal and no suicidal ideation. He expresses no suicidal plans and no homicidal plans.    ED Course  Procedures (including critical care time) Labs Review Labs Reviewed - No data to display  Imaging Review No results found.   EKG Interpretation None     At time of discharge after speaking with Dr. Mora Bellman and agreeing to take his prescription and fill them  He states that he will not fill them unless we physically give him the tablets  MDM   Final diagnoses:  Medication refill   Patient stormed out of the ED       Arman Filter, NP 12/07/13 2349

## 2013-12-07 NOTE — ED Notes (Signed)
Pt hostile upon arrival to ED, states "he does not need to speak anyone because his mother fucking record is in this system" - pt approached calmly and given support - pt later states he is here because he is unable to get his medications, he just started working and when he attempted to pick his meds up from behavioral health he was going to be made to pay for his meds and pt states he is unable to do so. Pt verbally hostile, cursing and yelling in waiting area. Pt also states "I need my damn meds because I'm going to tear some shit up."

## 2013-12-07 NOTE — Discharge Instructions (Signed)
Please make an appointment with your mental health clinic for further care

## 2013-12-08 ENCOUNTER — Encounter (HOSPITAL_COMMUNITY): Payer: Self-pay | Admitting: *Deleted

## 2013-12-08 ENCOUNTER — Emergency Department (HOSPITAL_COMMUNITY)
Admission: EM | Admit: 2013-12-08 | Discharge: 2013-12-08 | Disposition: A | Discharge status: Other Acute Inpatient Hospital | Payer: Self-pay | Attending: Emergency Medicine | Admitting: Emergency Medicine

## 2013-12-08 ENCOUNTER — Encounter (HOSPITAL_COMMUNITY): Payer: Self-pay | Admitting: Emergency Medicine

## 2013-12-08 ENCOUNTER — Observation Stay (HOSPITAL_COMMUNITY)
Admission: AD | Admit: 2013-12-08 | Discharge: 2013-12-09 | Payer: Federal, State, Local not specified - Other | Source: Intra-hospital | Attending: Psychiatry | Admitting: Psychiatry

## 2013-12-08 DIAGNOSIS — F1010 Alcohol abuse, uncomplicated: Secondary | ICD-10-CM | POA: Insufficient documentation

## 2013-12-08 DIAGNOSIS — R45851 Suicidal ideations: Secondary | ICD-10-CM | POA: Insufficient documentation

## 2013-12-08 DIAGNOSIS — G40909 Epilepsy, unspecified, not intractable, without status epilepticus: Secondary | ICD-10-CM | POA: Insufficient documentation

## 2013-12-08 DIAGNOSIS — Z9119 Patient's noncompliance with other medical treatment and regimen: Secondary | ICD-10-CM | POA: Insufficient documentation

## 2013-12-08 DIAGNOSIS — R45850 Homicidal ideations: Secondary | ICD-10-CM

## 2013-12-08 DIAGNOSIS — F17200 Nicotine dependence, unspecified, uncomplicated: Secondary | ICD-10-CM | POA: Insufficient documentation

## 2013-12-08 DIAGNOSIS — F319 Bipolar disorder, unspecified: Secondary | ICD-10-CM | POA: Insufficient documentation

## 2013-12-08 DIAGNOSIS — F3162 Bipolar disorder, current episode mixed, moderate: Secondary | ICD-10-CM

## 2013-12-08 DIAGNOSIS — F32A Depression, unspecified: Secondary | ICD-10-CM

## 2013-12-08 DIAGNOSIS — F3160 Bipolar disorder, current episode mixed, unspecified: Principal | ICD-10-CM | POA: Insufficient documentation

## 2013-12-08 DIAGNOSIS — Z79899 Other long term (current) drug therapy: Secondary | ICD-10-CM | POA: Insufficient documentation

## 2013-12-08 DIAGNOSIS — F329 Major depressive disorder, single episode, unspecified: Secondary | ICD-10-CM

## 2013-12-08 DIAGNOSIS — Z91199 Patient's noncompliance with other medical treatment and regimen due to unspecified reason: Secondary | ICD-10-CM | POA: Insufficient documentation

## 2013-12-08 DIAGNOSIS — R443 Hallucinations, unspecified: Secondary | ICD-10-CM

## 2013-12-08 HISTORY — DX: Major depressive disorder, single episode, unspecified: F32.9

## 2013-12-08 HISTORY — DX: Depression, unspecified: F32.A

## 2013-12-08 HISTORY — DX: Unspecified convulsions: R56.9

## 2013-12-08 LAB — COMPREHENSIVE METABOLIC PANEL
ALBUMIN: 4.2 g/dL (ref 3.5–5.2)
ALT: 14 U/L (ref 0–53)
ANION GAP: 17 — AB (ref 5–15)
AST: 22 U/L (ref 0–37)
Alkaline Phosphatase: 52 U/L (ref 39–117)
BUN: 11 mg/dL (ref 6–23)
CHLORIDE: 102 meq/L (ref 96–112)
CO2: 24 mEq/L (ref 19–32)
CREATININE: 1.04 mg/dL (ref 0.50–1.35)
Calcium: 9.5 mg/dL (ref 8.4–10.5)
GFR, EST NON AFRICAN AMERICAN: 88 mL/min — AB (ref >90)
GLUCOSE: 72 mg/dL (ref 70–99)
Potassium: 3.5 mEq/L — ABNORMAL LOW (ref 3.7–5.3)
Sodium: 143 mEq/L (ref 137–147)
Total Protein: 7.9 g/dL (ref 6.0–8.3)

## 2013-12-08 LAB — CBC
HEMATOCRIT: 44.6 % (ref 39.0–52.0)
HEMOGLOBIN: 15.2 g/dL (ref 13.0–17.0)
MCH: 31.3 pg (ref 26.0–34.0)
MCHC: 34.1 g/dL (ref 30.0–36.0)
MCV: 91.8 fL (ref 78.0–100.0)
Platelets: 192 10*3/uL (ref 150–400)
RBC: 4.86 MIL/uL (ref 4.22–5.81)
RDW: 13.4 % (ref 11.5–15.5)
WBC: 5.4 10*3/uL (ref 4.0–10.5)

## 2013-12-08 LAB — RAPID URINE DRUG SCREEN, HOSP PERFORMED
Amphetamines: NOT DETECTED
BARBITURATES: NOT DETECTED
Benzodiazepines: NOT DETECTED
COCAINE: NOT DETECTED
Opiates: NOT DETECTED
TETRAHYDROCANNABINOL: NOT DETECTED

## 2013-12-08 LAB — ACETAMINOPHEN LEVEL: Acetaminophen (Tylenol), Serum: <15 ug/mL (ref 10–30)

## 2013-12-08 LAB — SALICYLATE LEVEL: Salicylate Lvl: <2 mg/dL — ABNORMAL LOW (ref 2.8–20.0)

## 2013-12-08 LAB — ETHANOL: Alcohol, Ethyl (B): 66 mg/dL — ABNORMAL HIGH (ref 0–11)

## 2013-12-08 MED ORDER — ACETAMINOPHEN 325 MG PO TABS: 650.0000 mg | ORAL_TABLET | ORAL | Status: DC | PRN

## 2013-12-08 MED ORDER — DIVALPROEX SODIUM 250 MG PO DR TAB
500.0000 mg | DELAYED_RELEASE_TABLET | Freq: Three times a day (TID) | ORAL | Status: DC
  Administered 2013-12-08 (×2): 500 mg via ORAL
  Filled 2013-12-08 (×2): qty 2

## 2013-12-08 MED ORDER — ZOLPIDEM TARTRATE 5 MG PO TABS: 5.0000 mg | ORAL_TABLET | Freq: Every evening | ORAL | Status: DC | PRN

## 2013-12-08 MED ORDER — NICOTINE 21 MG/24HR TD PT24
21.0000 mg | MEDICATED_PATCH | Freq: Every day | TRANSDERMAL | Status: DC
  Administered 2013-12-08: 21 mg via TRANSDERMAL
  Filled 2013-12-08: qty 1

## 2013-12-08 MED ORDER — TRAZODONE HCL 100 MG PO TABS
100.0000 mg | ORAL_TABLET | Freq: Every day | ORAL | Status: DC
  Administered 2013-12-08: 100 mg via ORAL
  Filled 2013-12-08 (×3): qty 1

## 2013-12-08 MED ORDER — IBUPROFEN 200 MG PO TABS
600.0000 mg | ORAL_TABLET | Freq: Three times a day (TID) | ORAL | Status: DC | PRN
  Administered 2013-12-08 (×2): 600 mg via ORAL
  Filled 2013-12-08 (×4): qty 1

## 2013-12-08 MED ORDER — ONDANSETRON HCL 4 MG PO TABS: 4.0000 mg | ORAL_TABLET | Freq: Three times a day (TID) | ORAL | Status: DC | PRN

## 2013-12-08 MED ORDER — TRAZODONE HCL 50 MG PO TABS: 100.0000 mg | ORAL_TABLET | Freq: Every evening | ORAL | Status: DC | PRN

## 2013-12-08 MED ORDER — DIVALPROEX SODIUM 500 MG PO DR TAB
500.0000 mg | DELAYED_RELEASE_TABLET | Freq: Three times a day (TID) | ORAL | Status: DC
  Administered 2013-12-08 – 2013-12-09 (×2): 500 mg via ORAL
  Filled 2013-12-08 (×7): qty 1

## 2013-12-08 MED ORDER — IBUPROFEN 600 MG PO TABS: 600.0000 mg | ORAL_TABLET | Freq: Three times a day (TID) | ORAL | Status: DC | PRN

## 2013-12-08 MED ORDER — ALUM & MAG HYDROXIDE-SIMETH 200-200-20 MG/5ML PO SUSP: 30.0000 mL | ORAL | Status: DC | PRN

## 2013-12-08 NOTE — ED Provider Notes (Signed)
Patient presents for medication refill and wants to have his meds given to him.  He tells me he has been out for 4 months and does not think they will work.  There is no SI/HI, but the patietn does have psychotic features.  He agreed to accepting Rx for his home meds and following up with his regular doctor.  Medical screening examination/treatment/procedure(s) were conducted as a shared visit with non-physician practitioner(s) and myself.  I personally evaluated the patient during the encounter.   EKG Interpretation None        Tomasita Crumble, MD 12/08/13 [phone]

## 2013-12-08 NOTE — ED Notes (Signed)
Writer spoke with patient's RN to initiate tele-assessment. Awaiting machine to be placed in patient's room.

## 2013-12-08 NOTE — ED Notes (Signed)
Pt reporting SI today.  Pt found in front of church by [facility] and brought to ED after reporting SI stating he would step in front of car.  Pt requesting help.  Pt has attempted suicide in the past.  Pt discharged from [facility] earlier today.

## 2013-12-08 NOTE — ED Notes (Signed)
Clinician consulted with Maryjean Morn, PA-C who recommends inpatient psychiatric treatment at this time. No current beds at [facility]. TTS will seek placement at surrounding facilities. MD notified by Clinical research associate.   Janann Colonel. MSW, LCSW Therapeutic Triage Services-Triage Specialist   Phone: [phone] Fax: [phone]

## 2013-12-08 NOTE — ED Provider Notes (Signed)
CSN: [id number]     Arrival date & time 12/08/13  0112 History   First MD Initiated Contact with Patient 12/08/13 0144     Chief Complaint  Patient presents with  . Suicidal     (Consider location/radiation/quality/duration/timing/severity/associated sxs/prior Treatment) HPI 43 year old male presents to emergency department in with [employer], with complaint of suicidal ideation.  Patient reports that he was walking down the middle of the road trying to get hit by a car.  Patient reports that he has been working 2 jobs and having increasing stressors.  He recently gave his 2 week notice for both jobs.  Patient has history of depression, bipolar and prior suicide attempts.  He reports he has been out of his medications for the last 4 months.  Patient was seen earlier tonight at [place] commanding to be given his pills.  Patient reports he is unable to afford his medications.  He reports that he has not been going to [employer] as he has not had time in between working 2 jobs.  He does not feel that [employer] is helping him.  Patient is requesting admission for worsening depression and suicide ideation.  Past Medical History  Diagnosis Date  . Bipolar 1 disorder   . Seizures   . Depression    History reviewed. No pertinent past surgical history. History reviewed. No pertinent family history. History  Substance Use Topics  . Smoking status: Current Every Day Smoker  . Smokeless tobacco: Not on file  . Alcohol Use: No     Comment: Patient reports that he uses alcohol occassionaly     Review of Systems   See History of Present Illness; otherwise all other systems are reviewed and negative  Allergies  Review of patient's allergies indicates no known allergies.  Home Medications   Prior to Admission medications   Medication Sig Start Date End Date Taking? Authorizing Provider  divalproex (DEPAKOTE) 500 MG DR tablet Take 1 tablet (500 mg total) by mouth every 8 (eight) hours. 12/07/13   Arman Filter, NP  traZODone (DESYREL) 100 MG tablet Take 1 tablet (100 mg total) by mouth at bedtime as needed for sleep. 12/07/13   Arman Filter, NP   BP 118/81  Pulse 90  Temp(Src) 98.1 F (36.7 C)  Resp 16  SpO2 99% Physical Exam  Nursing note and vitals reviewed. Constitutional: He is oriented to person, place, and time. He appears well-developed and well-nourished.  HENT:  Head: Normocephalic and atraumatic.  Nose: Nose normal.  Mouth/Throat: Oropharynx is clear and moist.  Eyes: Conjunctivae and EOM are normal. Pupils are equal, round, and reactive to light.  Neck: Normal range of motion. Neck supple. No JVD present. No tracheal deviation present. No thyromegaly present.  Cardiovascular: Normal rate, regular rhythm, normal heart sounds and intact distal pulses.  Exam reveals no gallop and no friction rub.   No murmur heard. Pulmonary/Chest: Effort normal and breath sounds normal. No stridor. No respiratory distress. He has no wheezes. He has no rales. He exhibits no tenderness.  Abdominal: Soft. Bowel sounds are normal. He exhibits no distension and no mass. There is no tenderness. There is no rebound and no guarding.  Musculoskeletal: Normal range of motion. He exhibits no edema and no tenderness.  Lymphadenopathy:    He has no cervical adenopathy.  Neurological: He is alert and oriented to person, place, and time. He displays normal reflexes. He exhibits normal muscle tone. Coordination normal.  Skin: Skin is warm and dry. No  rash noted. No erythema. No pallor.  Psychiatric:  Patient reports depressed mood and suicidal ideation he denies any auditory or visual.  No homicidal thoughts.    ED Course  Procedures (including critical care time) Labs Review Labs Reviewed  COMPREHENSIVE METABOLIC PANEL - Abnormal; Notable for the following:    Potassium 3.5 (*)    Total Bilirubin <0.2 (*)    GFR calc non Af Amer 88 (*)    Anion gap 17 (*)    All other components within normal  limits  ETHANOL - Abnormal; Notable for the following:    Alcohol, Ethyl (B) 66 (*)    All other components within normal limits  SALICYLATE LEVEL - Abnormal; Notable for the following:    Salicylate Lvl <2.0 (*)    All other components within normal limits  ACETAMINOPHEN LEVEL  CBC  URINE RAPID DRUG SCREEN (HOSP PERFORMED)    Imaging Review No results found.   EKG Interpretation None      MDM   Final diagnoses:  Depression  Suicidal ideation    43 year old male history of depression bipolar and suicidal ideation who presents with suicidal ideation with plans to walk into traffic.  Patient has been seen by behavioral health who recommends admission.  They will work on trying to find him a bed.  Olivia Mackie, MD 12/08/13 [phone]

## 2013-12-08 NOTE — Consult Note (Signed)
  TTS Assessment-pt with hx of substance abuse and bipolar DO and ?Dependent Personality c/o he cant work and therefore cant afford [facility]/meds. Seeking free meds nite before in ED and now c/o suicidal depression.No beds at [facility].Refer for IP rx for dual diagnosis.

## 2013-12-08 NOTE — ED Notes (Signed)
Personal belongings given to [employer].

## 2013-12-08 NOTE — ED Notes (Signed)
[facility] writer called to take Pt to [facility].

## 2013-12-08 NOTE — ED Notes (Signed)
TTS placed at bedside.  

## 2013-12-08 NOTE — ED Notes (Signed)
Pt placed in wine scrubs and wanded by security.  

## 2013-12-08 NOTE — ED Notes (Signed)
Ordered a lunch tray for pt.

## 2013-12-08 NOTE — BH Assessment (Signed)
Tele Assessment Note   Matthew Mora is an 43 y.o. male who presents voluntarily to [facility] Emergency Department with the chief complaint of suicidal ideations with plan to jump in front of a car or lay down on top of railroad tracks. Patient states that he is depressed due to losing his two jobs after working them for 5 months. He reports that he was working at [employer] and that he had to put his two weeks notice in due to physical lifting that he was unable to maintain. Patient states that he has been depressed for two months and reports active symptoms that include social isolation, feelings of guilt, low motivation to complete daily activities, and insomnia. He shares that he was receiving outpatient services from [facility]; however he has been unable to obtain his medications due to financial hardship subsequently causing him to have been without his medications for 5 months. Patient states that he has no will to live anymore and that he is potentially being displaced from his home with his mother due to "our bills being behind". Patient reports that his sister also is diagnosed with depression and that he attempted to cope by drinking 1 beer last night to relieve his stress. He states that he lastly received inpatient psychiatric treatment at [facility]; however he was unable to state when this admission occurred. Patient continues to present with depressed mood in addition to congruent affect. Patient denies HI/AVH at this time.   Axis I: Major Depression, Recurrent severe Axis II: Deferred Axis III:  Past Medical History  Diagnosis Date  . Bipolar 1 disorder   . Seizures   . Depression    Axis IV: housing problems, occupational problems, other psychosocial or environmental problems, problems related to social environment and problems with primary support group Axis V: 11-20 some danger of hurting self or others possible OR occasionally fails to maintain minimal personal  hygiene OR gross impairment in communication  Past Medical History:  Past Medical History  Diagnosis Date  . Bipolar 1 disorder   . Seizures   . Depression     History reviewed. No pertinent past surgical history.  Family History: History reviewed. No pertinent family history.  Social History:  reports that he has been smoking.  He does not have any smokeless tobacco history on file. He reports that he does not drink alcohol or use illicit drugs.  Additional Social History:  Alcohol / Drug Use History of alcohol / drug use?: Yes Substance #1 Name of Substance 1: ETOH 1 - Age of First Use: 18 1 - Amount (size/oz): varies 1 - Frequency: rarely 1 - Duration: years 1 - Last Use / Amount: 12/07/13- 1 beer  CIWA: CIWA-Ar BP: 118/81 mmHg Pulse Rate: 90 COWS:    PATIENT STRENGTHS: (choose at least two) Capable of independent living Work skills  Allergies: No Known Allergies  Home Medications:  (Not in a hospital admission)  OB/GYN Status:  No LMP for male patient.  General Assessment Data Location of Assessment: [facility] Assessment Services Is this a Tele or Face-to-Face Assessment?: Tele Assessment Is this an Initial Assessment or a Re-assessment for this encounter?: Initial Assessment Living Arrangements: Parent Can pt return to current living arrangement?: Yes Admission Status: Voluntary Is patient capable of signing voluntary admission?: Yes Transfer from: Acute Hospital Referral Source: Self/Family/Friend     [facility] Crisis Care Plan Living Arrangements: Parent Name of Psychiatrist: Vesta Mixer Name of Therapist: Vesta Mixer  Education Status Is patient currently in school?:  No  Risk to self with the past 6 months Suicidal Ideation: Yes-Currently Present Suicidal Intent: Yes-Currently Present Is patient at risk for suicide?: Yes Suicidal Plan?: Yes-Currently Present Specify Current Suicidal Plan: Plan to walk out in traffic or lay on rail road tracks Access to Means:  Yes Specify Access to Suicidal Means: Access to streets What has been your use of drugs/alcohol within the last 12 months?: ETOH Previous Attempts/Gestures: Yes How many times?: 5 Triggers for Past Attempts: Unpredictable Intentional Self Injurious Behavior: Cutting Comment - Self Injurious Behavior: Hx of cutting in the past Family Suicide History: No Recent stressful life event(s): Financial Problems Persecutory voices/beliefs?: No Depression: Yes Depression Symptoms: Insomnia;Tearfulness;Isolating;Loss of interest in usual pleasures;Feeling worthless/self pity Substance abuse history and/or treatment for substance abuse?: Yes (Cocaine abuse history 5 years ago) Suicide prevention information given to non-admitted patients: Not applicable  Risk to Others within the past 6 months Homicidal Ideation: No Thoughts of Harm to Others: No Current Homicidal Intent: No Current Homicidal Plan: No Access to Homicidal Means: No Identified Victim: None History of harm to others?: No Assessment of Violence: None Noted Violent Behavior Description: Pt is calm and cooperative Does patient have access to weapons?: No Criminal Charges Pending?: No Does patient have a court date: No  Psychosis Hallucinations: None noted Delusions: None noted  Mental Status Report Appear/Hygiene: In scrubs Eye Contact: Poor Motor Activity: Unremarkable Speech: Logical/coherent Level of Consciousness: Quiet/awake Mood: Depressed;Helpless Affect: Depressed Anxiety Level: Minimal Thought Processes: Coherent;Relevant Judgement: Impaired Orientation: Person;Place;Time;Situation Obsessive Compulsive Thoughts/Behaviors: None  Cognitive Functioning Concentration: Decreased Memory: Recent Intact;Remote Intact IQ: Average Insight: Poor Impulse Control: Poor Appetite: Poor Sleep: Decreased Total Hours of Sleep:  (Unknown by patient) Vegetative Symptoms: None  ADLScreening [facility] Assessment  Services) Patient's cognitive ability adequate to safely complete daily activities?: Yes Patient able to express need for assistance with ADLs?: Yes Independently performs ADLs?: Yes (appropriate for developmental age)  Prior Inpatient Therapy Prior Inpatient Therapy: Yes Prior Therapy Dates: Unknown Prior Therapy Facilty/Provider(s): [facility] Reason for Treatment: SI  Prior Outpatient Therapy Prior Outpatient Therapy: Yes Prior Therapy Dates: 2015 Prior Therapy Facilty/Provider(s): [facility] Reason for Treatment: Med Management  ADL Screening (condition at time of admission) Patient's cognitive ability adequate to safely complete daily activities?: Yes Is the patient deaf or have difficulty hearing?: No Does the patient have difficulty seeing, even when wearing glasses/contacts?: No Does the patient have difficulty concentrating, remembering, or making decisions?: No Patient able to express need for assistance with ADLs?: Yes Does the patient have difficulty dressing or bathing?: No Independently performs ADLs?: Yes (appropriate for developmental age) Does the patient have difficulty walking or climbing stairs?: No Weakness of Legs: None Weakness of Arms/Hands: None  Home Assistive Devices/Equipment Home Assistive Devices/Equipment: None  Therapy Consults (therapy consults require a physician order) PT Evaluation Needed: No OT Evalulation Needed: No SLP Evaluation Needed: No Abuse/Neglect Assessment (Assessment to be complete while patient is alone) Physical Abuse: Denies Verbal Abuse: Denies Sexual Abuse: Denies Exploitation of patient/patient's resources: Denies Self-Neglect: Denies Values / Beliefs Cultural Requests During Hospitalization: None Spiritual Requests During Hospitalization: None Consults Spiritual Care Consult Needed: No Social Work Consult Needed: No Merchant navy officer (For Healthcare) Does patient have an advance directive?: No Would patient like  information on creating an advanced directive?: No - patient declined information    Additional Information 1:1 In Past 12 Months?: No CIRT Risk: No Elopement Risk: No Does patient have medical clearance?: Yes     Disposition:  Disposition Initial  Assessment Completed for this Encounter: Yes  PICKETT JR, Matthew Mora 12/08/2013 3:57 AM

## 2013-12-08 NOTE — ED Notes (Signed)
Pt resting in bed.

## 2013-12-08 NOTE — ED Notes (Signed)
TTS  Completed at [phone]

## 2013-12-08 NOTE — ED Notes (Signed)
Rechecked abnormal BP and was WNL

## 2013-12-08 NOTE — ED Notes (Signed)
Pt requesting ibuprofen for leg pain. Informed pt that he can have another dose at 1930 since he received a dose at 1130 this am.

## 2013-12-08 NOTE — ED Notes (Signed)
Staffing notified of need for sitter.  

## 2013-12-08 NOTE — ED Notes (Signed)
Pt's belongings x 2 labeled belongings bag and 1 black duffle bag at nurses' desk for inventory.

## 2013-12-09 DIAGNOSIS — F1010 Alcohol abuse, uncomplicated: Secondary | ICD-10-CM

## 2013-12-09 DIAGNOSIS — R45850 Homicidal ideations: Secondary | ICD-10-CM

## 2013-12-09 DIAGNOSIS — F3162 Bipolar disorder, current episode mixed, moderate: Secondary | ICD-10-CM

## 2013-12-09 DIAGNOSIS — F3160 Bipolar disorder, current episode mixed, unspecified: Secondary | ICD-10-CM

## 2013-12-09 DIAGNOSIS — R45851 Suicidal ideations: Secondary | ICD-10-CM

## 2013-12-09 MED ORDER — DIVALPROEX SODIUM 500 MG PO DR TAB: 500.0000 mg | DELAYED_RELEASE_TABLET | Freq: Three times a day (TID) | ORAL | Status: AC

## 2013-12-09 MED ORDER — TRAZODONE HCL 100 MG PO TABS: 100.0000 mg | ORAL_TABLET | Freq: Every evening | ORAL | Status: AC | PRN

## 2013-12-09 MED ORDER — ACETAMINOPHEN 325 MG PO TABS: 650.0000 mg | ORAL_TABLET | Freq: Four times a day (QID) | ORAL | Status: DC | PRN

## 2013-12-09 MED ORDER — MAGNESIUM HYDROXIDE 400 MG/5ML PO SUSP
30.0000 mL | Freq: Every day | ORAL | Status: DC | PRN
Start: 2013-12-09 — End: 2013-12-09

## 2013-12-09 MED ORDER — ALUM & MAG HYDROXIDE-SIMETH 200-200-20 MG/5ML PO SUSP
30.0000 mL | ORAL | Status: DC | PRN
Start: 2013-12-09 — End: 2013-12-09

## 2013-12-09 NOTE — Plan of Care (Signed)
[facility] Observation Crisis Plan  Reason for Crisis Plan:  Crisis Stabilization   Plan of Care:  Referral for Inpatient Hospitalization  Family Support:    None currently  Current Living Environment:  Living Arrangements: Other (Comment) (unknown); Homeless  Insurance:  Self Pay Hospital Account   Name Acct ID Class Status Primary Coverage   Matthew Mora, Matthew Mora [id number] BEHAVIORAL HEALTH OBSERVATION Open None        Guarantor Account (for Hospital Account [id number])   Name Relation to Pt Service Area Active? Acct Type   Matthew Mora Self [facility] Yes Behavioral Health   Address Phone       [address] [place], [place] [address] [phone](H)          Coverage Information (for Hospital Account [id number])   Not on file      Legal Guardian:   Self  Primary Care Provider:  No primary provider on file.  Current Outpatient Providers:  Matthew Mora  Psychiatrist:   Vesta Mora  Counselor/Therapist:   Probation Mora with Medications:  No; pt reports that he has been off medications for 5.5 months due to cost.  Additional Information: Per Matthew Farber, RN, [facility] has agreed to accept pt to the service of Dr Matthew Mora.  Matthew Head, NP has reportedly rounded on the pt and concurs with this decision.  Matthew Mora reports that she has already called nurse-to-nurse report to [facility] at [phone].  Discharge order is pending at this time.  After receiving it she will call [facility] to facilitate transport.  Matthew Canning, MA Triage Specialist Matthew Mora 9/28/201510:41 AM

## 2013-12-09 NOTE — Discharge Instructions (Addendum)
For your current behavioral health needs you have been accepted for admission to [facility]:       [facility]      [address]      [place], [place] [address]      [phone]  Observation Unit staff will arrange for transportation to the hospital by way of the [facility] transport agency.

## 2013-12-09 NOTE — H&P (Signed)
[facility] OBS UNIT H&P  *I have reviewed the comprehensive TTS assessment and I concur with its findings. Details have been updated to reflect current patient status.   Subjective: Pt seen and chart reviewed. Pt denies HI. Pt affirms SI and AVH. Pt states "I have so much going on in my head right now, I don't even know". Pt is not oriented to time/place/location/circumstance. However, pt is difficult to assess because of his current mental/emotional state as he affirms he is "very angry" right now and continues to raise his voice during the assessment. Pt is a poor historian due to stated difficulties in remembering past treatment events. Pt initially stated that he did not want to talk to [place]. This NP reminded pt that he cannot discharge to [facility] for treatment without this assessment; pt complied with assessment with some hesitancy. Pt did report that he "went back to work" when his meds ran out 5 months ago and that his primary concern with medication was inability to afford them. Pt reports that the onset of AVH, hearing voices and seeing spots, began approximately 2 weeks ago. Pt will be sent to [facility] via [facility] transport (bed waiting).   HPI: Matthew Mora is an 43 y.o. male who presents voluntarily to [facility] Emergency Department with the chief complaint of suicidal ideations with plan to jump in front of a car or lay down on top of railroad tracks. Patient states that he is depressed due to losing his two jobs after working them for 5 months. He reports that he was working at [employer] and that he had to put his two weeks notice in due to physical lifting that he was unable to maintain. Patient states that he has been depressed for two months and reports active symptoms that include social isolation, feelings of guilt, low motivation to complete daily activities, and insomnia. He shares that he was receiving outpatient services from [facility]; however he has been unable to obtain his  medications due to financial hardship subsequently causing him to have been without his medications for 5 months. Patient states that he has no will to live anymore and that he is potentially being displaced from his home with his mother due to "our bills being behind". Patient reports that his sister also is diagnosed with depression and that he attempted to cope by drinking 1 beer last night to relieve his stress. He states that he lastly received inpatient psychiatric treatment at [facility]; however he was unable to state when this admission occurred. Patient continues to present with depressed mood in addition to congruent affect. Patient denies HI/AVH at this time.   Axis I: Alcohol Abuse, Bipolar, mixed and rule out Substance-induced mood disorder Axis II: Deferred Axis III:  Past Medical History  Diagnosis Date  . Bipolar 1 disorder   . Seizures   . Depression    Axis IV: housing problems, occupational problems, other psychosocial or environmental problems, problems related to social environment and problems with primary support group Axis V: 11-20 some danger of hurting self or others possible OR occasionally fails to maintain minimal personal hygiene OR gross impairment in communication  Psychiatric Specialty Exam: Physical Exam Full Physical Exam performed in ED; reviewed, stable, and I concur with this assessment.   Review of Systems  Constitutional: Negative.   HENT: Negative.   Eyes: Negative.   Respiratory: Negative.   Cardiovascular: Negative.   Gastrointestinal: Negative.   Genitourinary: Negative.   Musculoskeletal: Negative.  Skin: Negative.   Neurological: Negative.   Endo/Heme/Allergies: Negative.   Psychiatric/Behavioral: Positive for depression. The patient is nervous/anxious.     Blood pressure 107/70, pulse 62, temperature 98.3 F (36.8 C), temperature source Oral, resp. rate 16, height  (1.778 m), weight 86.183 kg (190 lb), SpO2  100.00%.Body mass index is 27.26 kg/(m^2).  General Appearance: Fairly Groomed  Patent attorney::  Minimal  Speech:  Clear and Coherent  Volume:  Increased  Mood:  Angry, Anxious and Depressed  Affect:  Inappropriate, Labile and Restricted  Thought Process:  Circumstantial and Disorganized  Orientation:  Other:  Unable to assess due to pt noncompliance with assessment and pt's statement "I have too much going on in my head right now". Pt responds "negative" to orientation questions.  Thought Content:  Hallucinations: Auditory Visual AH, cannot verbalize, VH seeing spots  Suicidal Thoughts:  Yes.  with intent/plan  Homicidal Thoughts: Yes, but will not elaborate  Memory:  Immediate;   Poor Recent;   Poor Remote;   Poor  Judgement:  Impaired  Insight:  Lacking  Psychomotor Activity:  Decreased  Concentration:  Fair  Recall:  Fair  Akathisia:  No  Handed:    AIMS (if indicated):     Assets:  Desire for Improvement Resilience  Sleep:       Past Medical History:  Past Medical History  Diagnosis Date  . Bipolar 1 disorder   . Seizures   . Depression     History reviewed. No pertinent past surgical history.  Family History: No family history on file.  Social History:  reports that he has been smoking.  He does not have any smokeless tobacco history on file. He reports that he does not drink alcohol or use illicit drugs.  Additional Social History:     CIWA: CIWA-Ar BP: 107/70 mmHg Pulse Rate: 62 Nausea and Vomiting: no nausea and no vomiting Tactile Disturbances: none Tremor: no tremor Auditory Disturbances: not present Paroxysmal Sweats: no sweat visible Visual Disturbances: not present Anxiety: two Headache, Fullness in Head: none present Agitation: normal activity Orientation and Clouding of Sensorium: oriented and can do serial additions CIWA-Ar Total: 2 COWS:    PATIENT STRENGTHS: (choose at least two) Capable of independent living Work skills  Allergies: No  Known Allergies  Home Medications:  Medications Prior to Admission  Medication Sig Dispense Refill  . divalproex (DEPAKOTE) 500 MG DR tablet Take 1 tablet (500 mg total) by mouth every 8 (eight) hours.  90 tablet  0  . traZODone (DESYREL) 100 MG tablet Take 1 tablet (100 mg total) by mouth at bedtime as needed for sleep.  30 tablet  0    Plan: Discharge to [facility] for inpatient management and stabilization.     Beau Fanny, FNP-BC 12/09/2013 9:19 AM  Patient reviewed with me as above.

## 2013-12-09 NOTE — Progress Notes (Signed)
Patient ID: Matthew Mora, male   DOB: [birth year], 43 y.o.   MRN: [id number] D-Initial contact this am was he was tired and lethargic. He states he slept fairly well last HS, he is pleasant but minimal verbally.He was informed that he would be going to [facility] from here, and he is good with that and states he feels some relief knowing he was going some where.He complains of right leg pain from a recent fall. A-Support offered. Called nurse to nurse report to [facility]. Contacted Conrad NP to ask him to complete the discharge so he can go to [facility]. Monitored for safety. R-No complaints voiced. Waiting for transfer to [facility]. Will call [facility] when orders are complete for transport.

## 2013-12-09 NOTE — Discharge Summary (Signed)
[facility] OBS UNIT DISCHARGE SUMMARY *Pt evaluated once due to short LOS.  Subjective: Pt seen and chart reviewed. Pt denies HI. Pt affirms SI and AVH. Pt states "I have so much going on in my head right now, I don't even know". Pt is not oriented to time/place/location/circumstance. However, pt is difficult to assess because of his current mental/emotional state as he affirms he is "very angry" right now and continues to raise his voice during the assessment. Pt is a poor historian due to stated difficulties in remembering past treatment events. Pt initially stated that he did not want to talk to [place]. This NP reminded pt that he cannot discharge to [facility] for treatment without this assessment; pt complied with assessment with some hesitancy. Pt did report that he "went back to work" when his meds ran out 5 months ago and that his primary concern with medication was inability to afford them. Pt reports that the onset of AVH, hearing voices and seeing spots, began approximately 2 weeks ago. Pt will be sent to [facility] via [facility] transport (bed waiting).   HPI: Matthew Mora is an 43 y.o. male who presents voluntarily to [facility] Emergency Department with the chief complaint of suicidal ideations with plan to jump in front of a car or lay down on top of railroad tracks. Patient states that he is depressed due to losing his two jobs after working them for 5 months. He reports that he was working at [employer] and that he had to put his two weeks notice in due to physical lifting that he was unable to maintain. Patient states that he has been depressed for two months and reports active symptoms that include social isolation, feelings of guilt, low motivation to complete daily activities, and insomnia. He shares that he was receiving outpatient services from [facility]; however he has been unable to obtain his medications due to financial hardship subsequently causing him to have been without his medications for  5 months. Patient states that he has no will to live anymore and that he is potentially being displaced from his home with his mother due to "our bills being behind". Patient reports that his sister also is diagnosed with depression and that he attempted to cope by drinking 1 beer last night to relieve his stress. He states that he lastly received inpatient psychiatric treatment at [facility]; however he was unable to state when this admission occurred. Patient continues to present with depressed mood in addition to congruent affect. Patient denies HI/AVH at this time.   Axis I: Alcohol Abuse, Bipolar, mixed and rule out Substance-induced mood disorder Axis II: Deferred Axis III:  Past Medical History  Diagnosis Date  . Bipolar 1 disorder   . Seizures   . Depression    Axis IV: housing problems, occupational problems, other psychosocial or environmental problems, problems related to social environment and problems with primary support group Axis V: 11-20 some danger of hurting self or others possible OR occasionally fails to maintain minimal personal hygiene OR gross impairment in communication  Psychiatric Specialty Exam: Physical Exam Full Physical Exam performed in ED; reviewed, stable, and I concur with this assessment.   Review of Systems  Constitutional: Negative.   HENT: Negative.   Eyes: Negative.   Respiratory: Negative.   Cardiovascular: Negative.   Gastrointestinal: Negative.   Genitourinary: Negative.   Musculoskeletal: Negative.   Skin: Negative.   Neurological: Negative.   Endo/Heme/Allergies: Negative.   Psychiatric/Behavioral: Positive for depression.  The patient is nervous/anxious.     Blood pressure 107/70, pulse 62, temperature 98.3 F (36.8 C), temperature source Oral, resp. rate 16, height  (1.778 m), weight 86.183 kg (190 lb), SpO2 100.00%.Body mass index is 27.26 kg/(m^2).  General Appearance: Fairly Groomed  Patent attorney::  Minimal   Speech:  Clear and Coherent  Volume:  Increased  Mood:  Angry, Anxious and Depressed  Affect:  Inappropriate, Labile and Restricted  Thought Process:  Circumstantial and Disorganized  Orientation:  Other:  Unable to assess due to pt noncompliance with assessment and pt's statement "I have too much going on in my head right now". Pt responds "negative" to orientation questions.  Thought Content:  Hallucinations: Auditory Visual AH, cannot verbalize, VH seeing spots  Suicidal Thoughts:  Yes.  with intent/plan  Homicidal Thoughts: Yes, but will not elaborate  Memory:  Immediate;   Poor Recent;   Poor Remote;   Poor  Judgement:  Impaired  Insight:  Lacking  Psychomotor Activity:  Decreased  Concentration:  Fair  Recall:  Fair  Akathisia:  No  Handed:    AIMS (if indicated):     Assets:  Desire for Improvement Resilience  Sleep:       Past Medical History:  Past Medical History  Diagnosis Date  . Bipolar 1 disorder   . Seizures   . Depression     History reviewed. No pertinent past surgical history.  Family History: No family history on file.  Social History:  reports that he has been smoking.  He does not have any smokeless tobacco history on file. He reports that he does not drink alcohol or use illicit drugs.  Additional Social History:     CIWA: CIWA-Ar BP: 107/70 mmHg Pulse Rate: 62 Nausea and Vomiting: no nausea and no vomiting Tactile Disturbances: none Tremor: no tremor Auditory Disturbances: not present Paroxysmal Sweats: no sweat visible Visual Disturbances: not present Anxiety: two Headache, Fullness in Head: none present Agitation: normal activity Orientation and Clouding of Sensorium: oriented and can do serial additions CIWA-Ar Total: 2 COWS:    PATIENT STRENGTHS: (choose at least two) Capable of independent living Work skills  Allergies: No Known Allergies  Home Medications:  Medications Prior to Admission  Medication Sig Dispense Refill   . divalproex (DEPAKOTE) 500 MG DR tablet Take 1 tablet (500 mg total) by mouth every 8 (eight) hours.  90 tablet  0  . traZODone (DESYREL) 100 MG tablet Take 1 tablet (100 mg total) by mouth at bedtime as needed for sleep.  30 tablet  0    Plan: Discharge to [facility] for inpatient management and stabilization.     Beau Fanny, FNP-BC 12/09/2013 9:19 AM   Patient seen, Suicide Assessment Completed.  Disposition Plan Reviewed

## 2013-12-09 NOTE — Progress Notes (Signed)
[facility] INPATIENT:  Family/Significant Other Suicide Prevention Education  Suicide Prevention Education:  Patient Refusal for Family/Significant Other Suicide Prevention Education: The patient Matthew Mora has refused to provide written consent for family/significant other to be provided Family/Significant Other Suicide Prevention Education during admission and/or prior to discharge.  Physician notified.  Glenice Laine B 12/09/2013, 12:06 AM

## 2013-12-09 NOTE — Plan of Care (Signed)
[facility] Observation Crisis Plan  Reason for Crisis Plan:  Crisis Stabilization   Plan of Care:  Referral for Telepsychiatry/Psychiatric Consult  Family Support:      Current Living Environment:  Living Arrangements: Other (Comment) (unknown)  Insurance:   Hospital Account   Name Acct ID Class Status Primary Coverage   Matthew Mora, Matthew Mora [id number] BEHAVIORAL HEALTH OBSERVATION Open None        Guarantor Account (for Hospital Account [id number])   Name Relation to Pt Service Area Active? Acct Type   Matthew Mora Self [facility] Yes Behavioral Health   Address Phone       [address] [place], [place] [address] [phone](H)          Coverage Information (for Hospital Account [id number])   Not on file      Legal Guardian:     Primary Care Provider:  No primary provider on file.  Current Outpatient Providers:  none  Psychiatrist:   none  Counselor/Therapist:   counsellor  Compliant with Medications:  Yes  Additional Information:   Matthew Mora 9/28/201512:21 AM

## 2013-12-09 NOTE — [facility] Suicide Risk Assessment (Signed)
Demographic Factors:  43 year old male   Total Time spent with patient: 20 minutes  Psychiatric Specialty Exam: Physical Exam  ROS  Blood pressure 107/70, pulse 62, temperature 98.3 F (36.8 C), temperature source Oral, resp. rate 16, height  (1.778 m), weight 86.183 kg (190 lb), SpO2 100.00%.Body mass index is 27.26 kg/(m^2).  General Appearance: Fairly Groomed- guarded, angry  Eye Contact::  Minimal  Speech:  speaks only at times, at others does not answer questions  Volume:  intermittently loud   Mood:  Depressed and Dysphoric  Affect:  quite irritable  Thought Process:  difficult to assess, but seems linear  Orientation:  Other:  refuses to answer any questions exploring orientation, cognitive status, but seems fully alert and attentive, and does not appera confused or delirious at this time  Thought Content:  describes auditory hallucinations, but refuses to ellaborate beyond this .   Suicidal Thoughts:  Yes.  without intent/plan- when asked about SI and HI answers " yes" to both, but does not elaborate further and does not express any plan . Later did state he had no HI towards anyone in particular, and was simply feeling angry and " homicidal "  Homicidal Thoughts:  Yes.  without intent/plan  Memory:  difficult to assess due to lack of cooperation  Judgement:  Poor  Insight:  Lacking  Psychomotor Activity:  in bed, minimal eye contact, specifically moving sheets and body to minimize eye contact or interaction with Clinical research associate  Concentration:  NA  Recall:  [employer] of Knowledge:NA  Language: Fair  Akathisia:  Negative  Handed:  Right  AIMS (if indicated):     Assets:  Resilience  Sleep:       Musculoskeletal: Strength & Muscle Tone: gait / neurological status not examined , but no report of any abnormality Gait & Station: as above  Patient leans: N/A   Mental Status Per Nursing Assessment::   On Admission:     Current Mental Status by Physician: Patient  presents angry, poorly related,  Minimal eye contact, only minimal cooperation with interview at this time, states he feels depressed, and states he is suicidal , homicidal and hallucinating, but does not elaborate on any of these issues .  Loss Factors: Recently quit job, financial difficulties  Historical Factors: Patient has history of  Mood Disorder, and has been diagnosed with Bipolar Disorder in the past. Reportedly had stopped medications several weeks ago due to being unable to afford them. States he does not remember what medications he was taiking. Risk Reduction Factors:   resilience   Continued Clinical Symptoms:  As noted, at this time patient quite symptomatic, with significant dysphoria and irritability/anger. Eye contact and cooperation are poor/ decreased. Endorses, but does not elaborate or describe, hallucinations, suicidal and homicidal ideations.  Cognitive Features That Contribute To Risk:  Difficult to assess- seems fully alert and attentive at this time/   Suicide Risk:  Severe:  Frequent, intense, and enduring suicidal ideation, specific plan, no subjective intent, but some objective markers of intent (i.e., choice of lethal method), the method is accessible, some limited preparatory behavior, evidence of impaired self-control, severe dysphoria/symptomatology, multiple risk factors present, and few if any protective factors, particularly a lack of social support.  Discharge Diagnoses:   AXIS I:  Mood disorder NOS, consider Bipolar DIsorder Mixed  AXIS II:  Deferred AXIS III:   Past Medical History  Diagnosis Date  . Bipolar 1 disorder   . Seizures   .  Depression    AXIS IV:  Financial, job related stressors  AXIS V:  21-30 behavior considerably influenced by delusions or hallucinations OR serious impairment in judgment, communication OR inability to function in almost all areas  Plan Of Care/Follow-up recommendations:  Activity:  As tolerated Diet:   NA Tests:  NA Other:  See below  Is patient on multiple antipsychotic therapies at discharge:  No   Has Patient had three or more failed trials of antipsychotic monotherapy by history:  No  Recommended Plan for Multiple Antipsychotic Therapies: NA  As discussed with Nursing Staff and NP, patient is accepted to [facility] and is pending ambulance  transportation there .  COBOS, FERNANDO 12/09/2013, 10:31 AM

## 2013-12-09 NOTE — Progress Notes (Signed)
Patient ID: Matthew Mora, male   DOB: August 15, 1970, 43 y.o.   MRN: [id number] Discharge Note-Social work has arranged for his admission to [facility]. RN to RN report already called to Alecia Lemming. Seen by Renata Caprice NP and Dr. Jama Flavors for discharge orders and notes. Transportation arranged with [facility]. All personal items brought in returned to client.Is passively suicidal. He states he has just been so stressed due to finances. Difficulty at his moms house, his only support and recently quitting his job because it was too physical for him. Pleasant with discharge process. He states he has been to [facility] before but couldn't remember when.

## 2014-11-01 IMAGING — CR DG SHOULDER 2+V*L*
3 series · 3 of 3 positions shown · non-contrast
Comparison: None.

CLINICAL DATA: Status post fall, with left shoulder pain.

LEFT SHOULDER - 2+ VIEW

[w shoulder internal left]
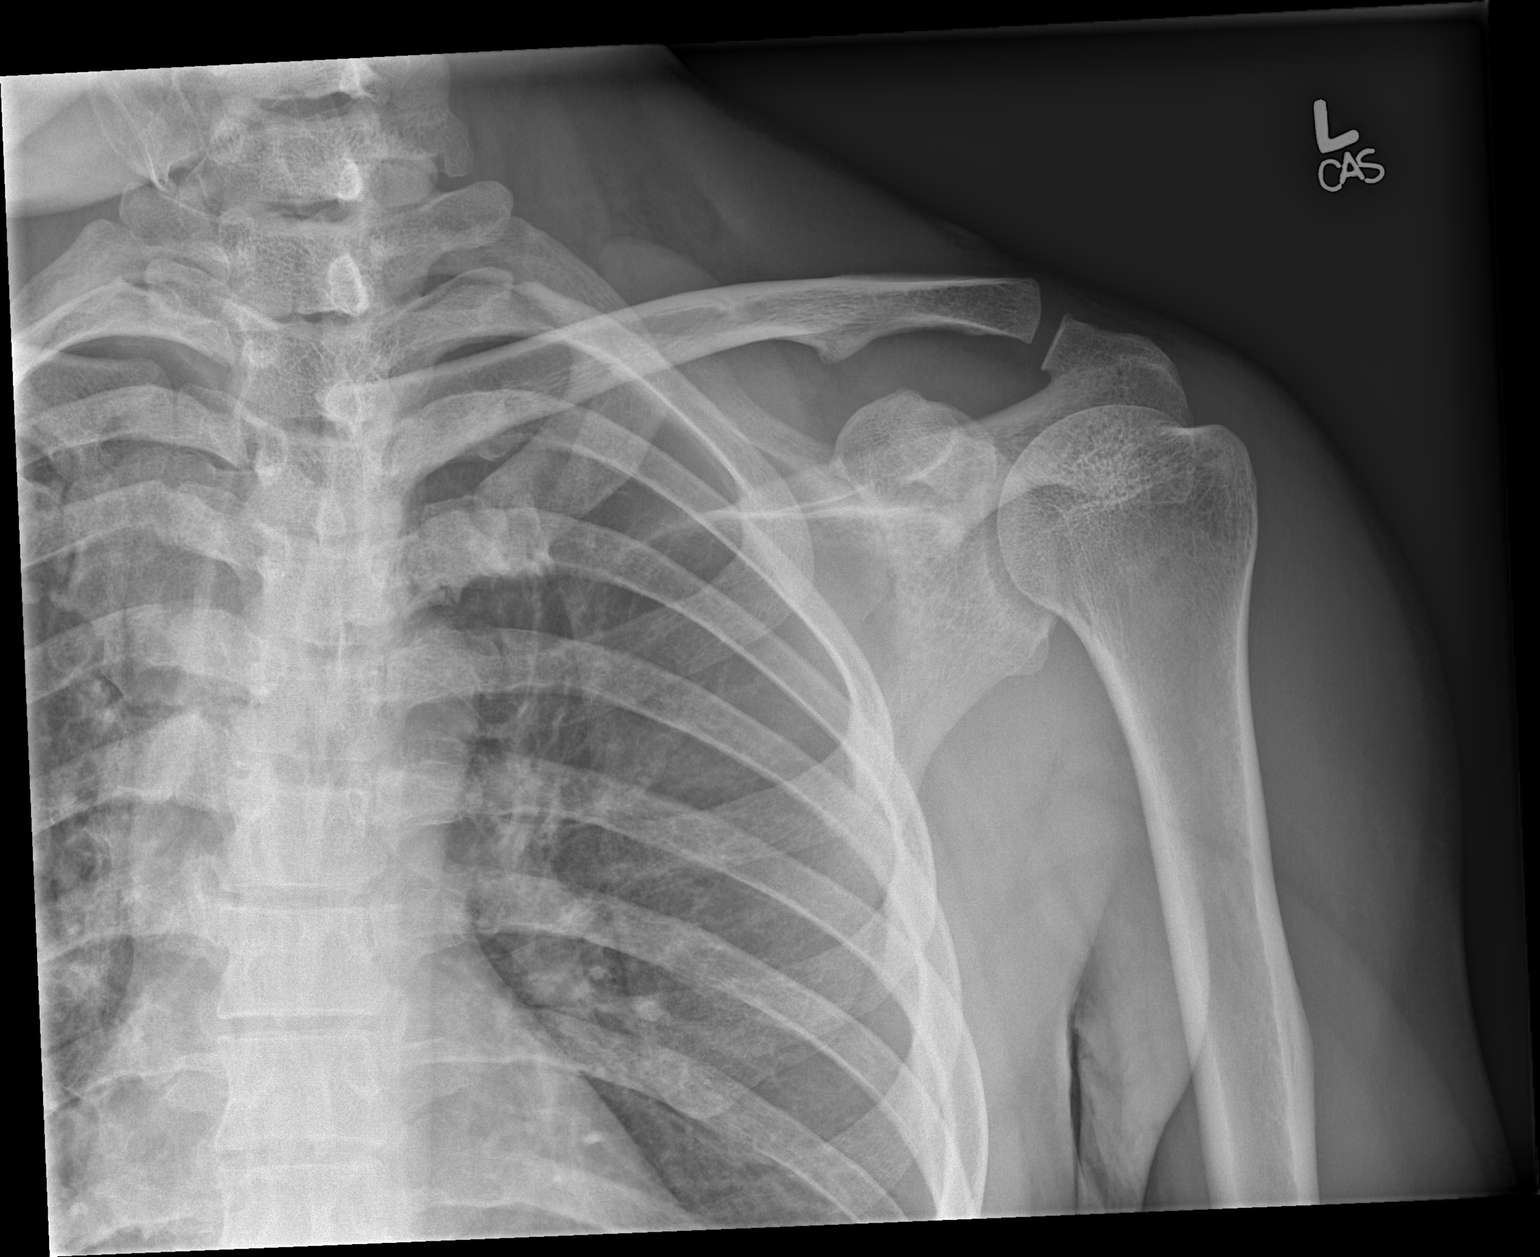

[w shoulder external left]
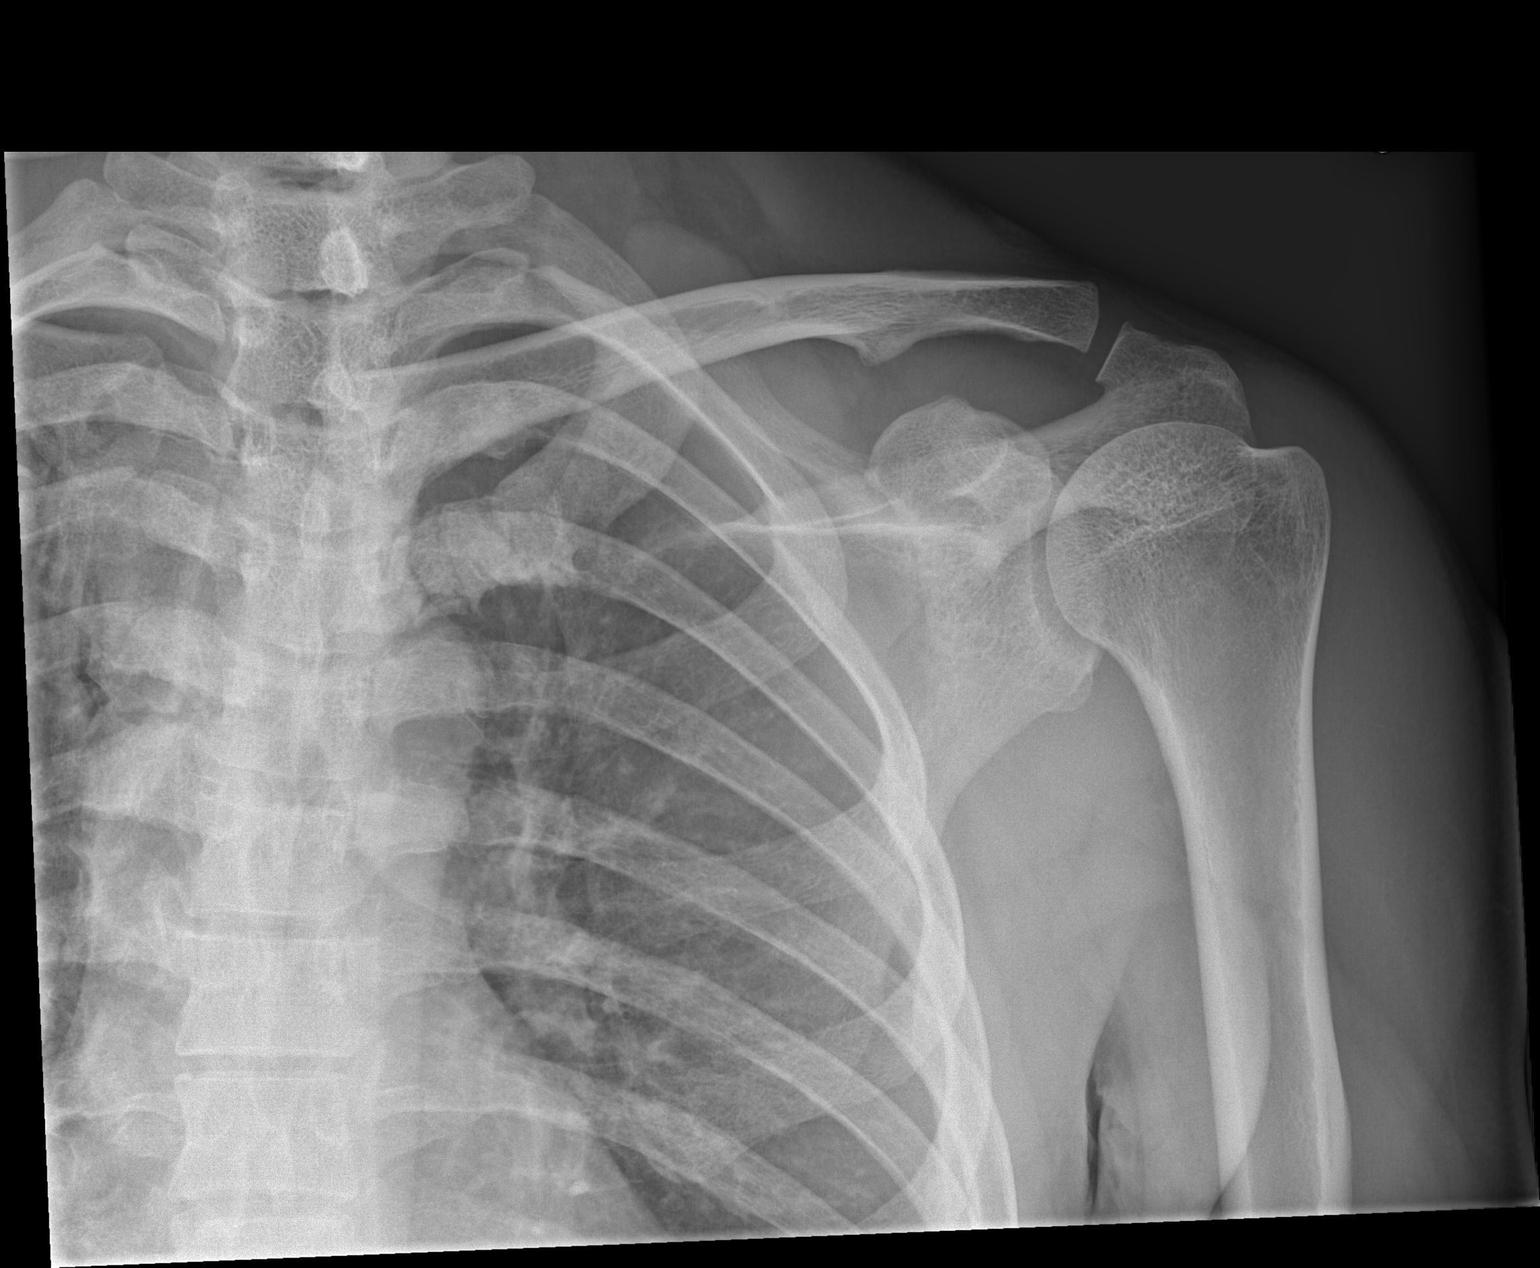

[w shoulder y-view left]
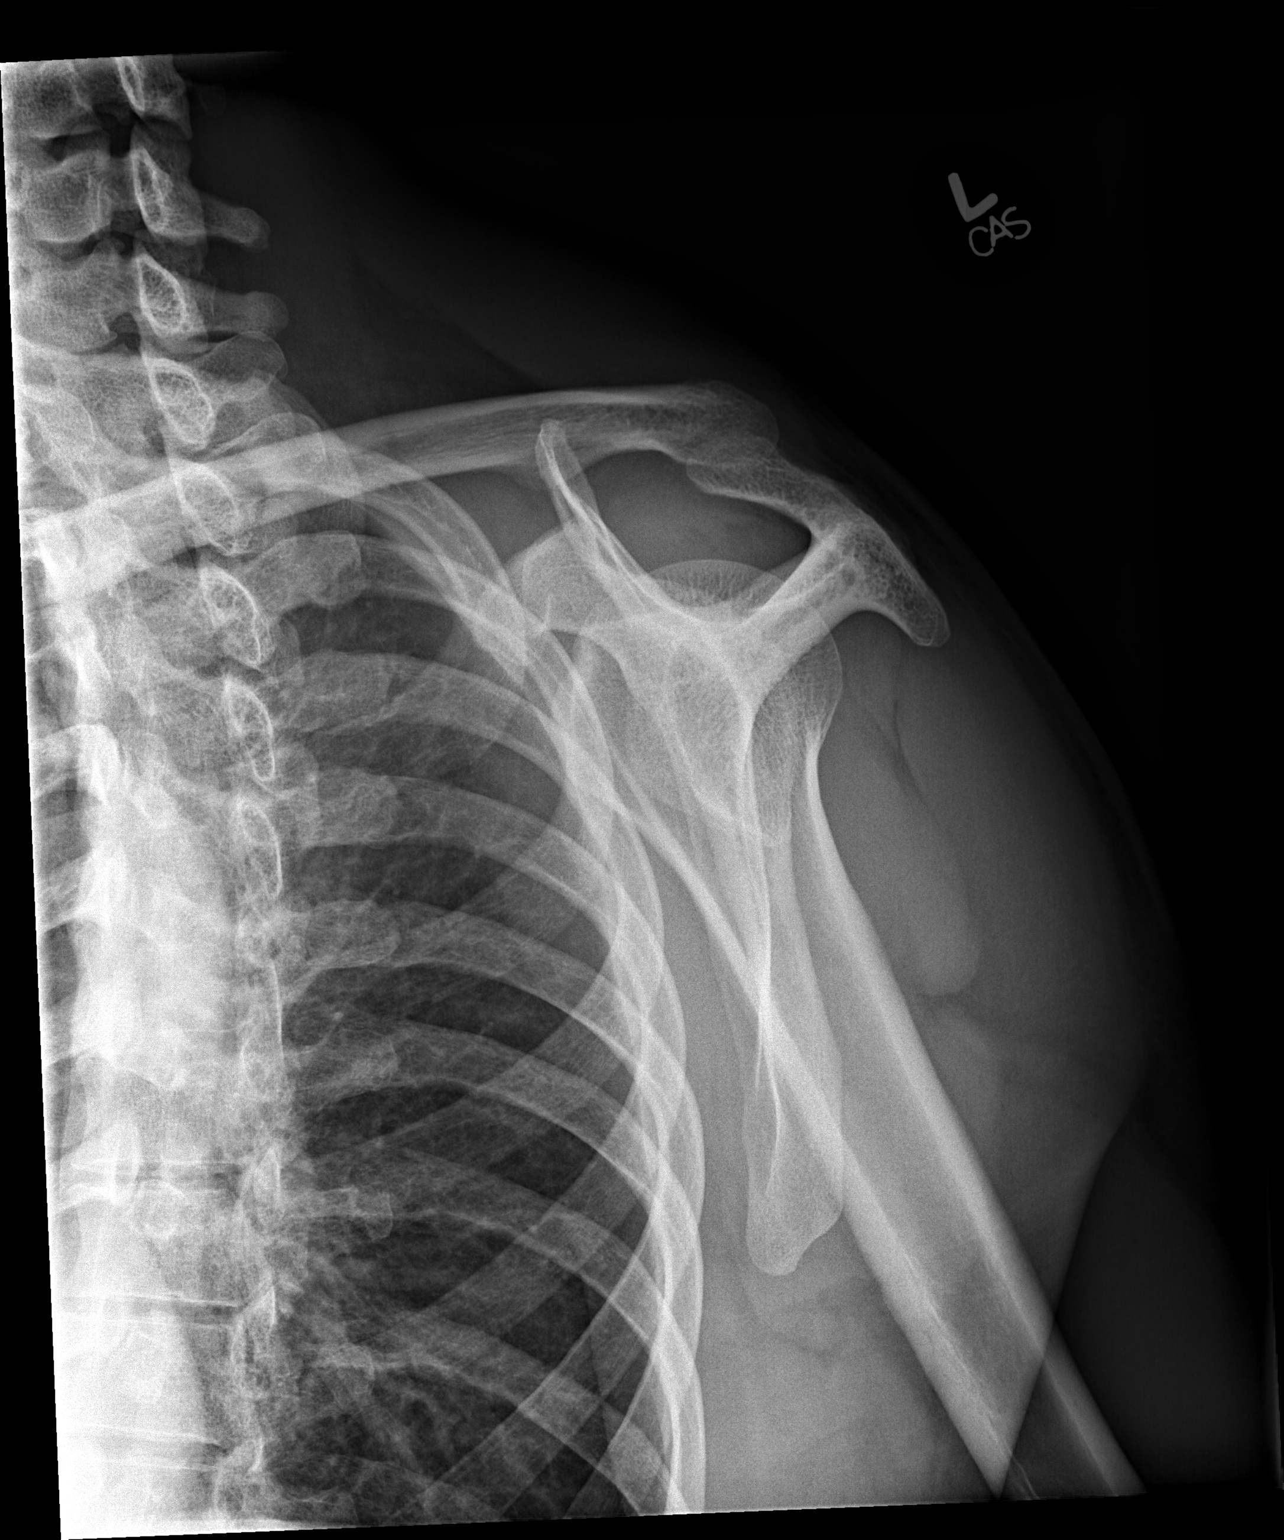

[3 of 3 positions shown; findings below may reference images not displayed]

FINDINGS: There is no evidence of fracture or dislocation.  The
left humeral head is seated within the glenoid fossa.  Slight
superior displacement of the distal clavicle at the left
acromioclavicular joint likely remains within normal limits,
without significant separation.  No significant soft tissue
abnormalities are seen.  The visualized portions of the left lung
are clear.
IMPRESSION: No evidence of fracture or dislocation.
